# Patient Record
Sex: Male | Born: 1975 | Race: White | Hispanic: No | Smoking: Former smoker
Health system: Southern US, Community
[De-identification: ages and names within clinical notes are randomized; demographics above are authoritative.]

## PROBLEM LIST (undated history)

## (undated) DIAGNOSIS — F419 Anxiety disorder, unspecified: Secondary | ICD-10-CM

## (undated) DIAGNOSIS — N189 Chronic kidney disease, unspecified: Secondary | ICD-10-CM

## (undated) DIAGNOSIS — Z87442 Personal history of urinary calculi: Secondary | ICD-10-CM

## (undated) DIAGNOSIS — S81801S Unspecified open wound, right lower leg, sequela: Secondary | ICD-10-CM

## (undated) DIAGNOSIS — M25571 Pain in right ankle and joints of right foot: Secondary | ICD-10-CM

## (undated) HISTORY — DX: Anxiety disorder, unspecified: F41.9

## (undated) HISTORY — DX: Chronic kidney disease, unspecified: N18.9

## (undated) HISTORY — PX: KNEE ARTHROSCOPY: SUR90

---

## 2005-04-25 ENCOUNTER — Emergency Department: Payer: Self-pay | Admitting: Emergency Medicine

## 2011-12-17 ENCOUNTER — Ambulatory Visit: Payer: Self-pay | Admitting: Specialist

## 2012-01-01 ENCOUNTER — Ambulatory Visit: Payer: Self-pay | Admitting: Specialist

## 2013-01-05 ENCOUNTER — Ambulatory Visit: Payer: Self-pay | Admitting: General Practice

## 2013-01-18 ENCOUNTER — Encounter: Payer: Self-pay | Admitting: Physician Assistant

## 2013-01-19 ENCOUNTER — Encounter: Payer: Self-pay | Admitting: Physician Assistant

## 2013-01-22 ENCOUNTER — Emergency Department: Payer: Self-pay | Admitting: Emergency Medicine

## 2013-11-23 ENCOUNTER — Emergency Department: Payer: Self-pay | Admitting: Emergency Medicine

## 2013-11-23 LAB — CBC
HCT: 44.8 % (ref 40.0–52.0)
HGB: 14.8 g/dL (ref 13.0–18.0)
MCH: 30.5 pg (ref 26.0–34.0)
MCHC: 33 g/dL (ref 32.0–36.0)
MCV: 92 fL (ref 80–100)
Platelet: 341 10*3/uL (ref 150–440)
RBC: 4.85 10*6/uL (ref 4.40–5.90)
RDW: 13.5 % (ref 11.5–14.5)
WBC: 15.5 10*3/uL — AB (ref 3.8–10.6)

## 2013-11-23 LAB — BASIC METABOLIC PANEL
Anion Gap: 6 — ABNORMAL LOW (ref 7–16)
BUN: 10 mg/dL (ref 7–18)
CALCIUM: 9 mg/dL (ref 8.5–10.1)
CHLORIDE: 104 mmol/L (ref 98–107)
Co2: 26 mmol/L (ref 21–32)
Creatinine: 1.32 mg/dL — ABNORMAL HIGH (ref 0.60–1.30)
EGFR (African American): >60
EGFR (Non-African Amer.): >60
Glucose: 102 mg/dL — ABNORMAL HIGH (ref 65–99)
Osmolality: 271 (ref 275–301)
Potassium: 3.8 mmol/L (ref 3.5–5.1)
Sodium: 136 mmol/L (ref 136–145)

## 2013-11-23 LAB — TROPONIN I: Troponin-I: <0.02 ng/mL

## 2014-08-08 NOTE — Op Note (Signed)
PATIENT NAME:  Gary Savage, Gary Savage MR#:  [id number] DATE OF BIRTH:  July 10, 1975  DATE OF PROCEDURE:  01/01/2012  PREOPERATIVE DIAGNOSIS: Tear posterior horn lateral meniscus, left knee.  POSTOPERATIVE DIAGNOSIS: Tear posterior horn lateral meniscus, left knee.   PROCEDURE: Arthroscopic partial lateral meniscectomy, left knee.   SURGEON: Clare Gandyhristopher E. Rolen Conger, MD  ANESTHESIA: General.   COMPLICATIONS: None.   DESCRIPTION OF PROCEDURE: After adequate induction of general anesthesia, the left lower extremity is secured in the legholder in the usual manner for arthroscopy. The left knee is thoroughly prepped with alcohol and ChloraPrep and draped in standard sterile fashion. The joint is infiltrated with 10 mL of Marcaine with epinephrine. Diagnostic arthroscopy is performed. There is minimal increased synovium present. The patellofemoral joint is normal. The medial compartment is within normal limits with normal articular cartilage and a normal medial meniscus. In the intercondylar notch the anterior cruciate ligament is normal. In the lateral compartment, the articular surfaces are normal. There is seen to be a large horizontal cleavage-type tear of the posterior horn of the lateral meniscus. Using a combination of the basket forceps, the full radial resector and the ArthroWand, the torn portion of the meniscus is resected back to a stable rim. Careful search for any other pathology is negative. The joint is thoroughly irrigated multiple times. Portals are closed with 5-0 nylon. Soft bulky dressing is applied. Patient is returned to recovery room in satisfactory condition having tolerated the procedure quite well.    ____________________________ Clare Gandyhristopher E. Julieann Drummonds, MD ces:cms D: 01/01/2012 10:47:28 ET T: 01/01/2012 11:02:09 ET JOB#: [id number]  cc: Clare Gandyhristopher E. Crystelle Ferrufino, MD, <Dictator> Clare GandyHRISTOPHER E Ines Rebel MD ELECTRONICALLY SIGNED 01/02/2012 13:28

## 2015-01-26 ENCOUNTER — Encounter: Payer: Self-pay | Admitting: Physician Assistant

## 2015-01-26 ENCOUNTER — Ambulatory Visit: Payer: Self-pay | Admitting: Physician Assistant

## 2015-01-26 VITALS — BP 119/79 | HR 98 | Temp 98.0°F

## 2015-01-26 DIAGNOSIS — M5010 Cervical disc disorder with radiculopathy, unspecified cervical region: Secondary | ICD-10-CM

## 2015-01-26 NOTE — Progress Notes (Signed)
   Subjective:    Patient ID: Gary Savage, male    DOB: Mar 07, 1976, 39 y.o.   MRN: [id number]  HPI Patient c/o 2 day of neck pain upon awakening. 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  States radicular numbness to left upper extremities. Denies loss of function. Last incident greater than 3 years ago.    Review of Systems Only for above compliant     Objective:   Physical Exam Mild distress. Holding arm in adduction.  No cervical deformities. Decrease ROM with right lateral neck movement. Mild guarding at C-5 and C-6.  Vascular intact left upper extremity.        Assessment & Plan:  Cervical radicular pain to left upper extremity.  Start Prednisone, Tramadol, and Flexeril as directed.  Arm sling for comfort.  Follow up in 5 days.Marland Kitchen

## 2015-02-02 ENCOUNTER — Encounter: Payer: Self-pay | Admitting: Physician Assistant

## 2015-02-02 ENCOUNTER — Ambulatory Visit: Payer: Self-pay | Admitting: Physician Assistant

## 2015-02-02 VITALS — BP 108/75 | HR 104 | Temp 98.2°F

## 2015-02-02 DIAGNOSIS — R1110 Vomiting, unspecified: Secondary | ICD-10-CM

## 2015-02-02 MED ORDER — PROMETHAZINE HCL 25 MG PO TABS: 25.0000 mg | ORAL_TABLET | Freq: Three times a day (TID) | ORAL | Status: DC | PRN

## 2015-02-02 NOTE — Patient Instructions (Addendum)

## 2015-02-02 NOTE — Progress Notes (Signed)
[place]  [facility]   OCCUPATIONAL HEALTH PHYSICIAN ASSISTANT CLINIC HEALTH RECOMMENDATIONS  NAME: Dolores FrameKEVIN Rhyne DEPARTMENT: [place] [facility]  SCREEN REPORT: Based on this screening exam, there is a detected condition which would place the examinee, fellow employees, or patients at increased risk of physical impairment from his/her work duties  DIAGNOSIS: VOMITING   RECOMMENDATIONS/RESTSRICTIONS LISTED BELOW:  NO WORK TODAY 02/02/15, 10/15 IF NOT BETTER     SIGNATURE:___________________________________________ DATE:________

## 2015-02-02 NOTE — Progress Notes (Signed)
S:  Pt c/o vomiting, sx for 1 day, no fever/chills, no abd pain  denies cp/sob, denies camping, bad food, recent antibiotics, or exposure to bad water; vomited multiple times last night, last vomited at 3 am today, no diarrhea; also now has runny nose and congestion which started this morning, no cough, mucus was green this morning Remainder ros neg  O:  Vitals wnl except pulse increased at 104; nad, ENT wnl, neck supple no lymph, lungs c t a, cv rrr, abd soft nontender bs normalb/l, neuro intact  A:  Viral gastroenteritis, dehydration  P:  Reassurance, fluids, brat diet, immodium ad for diarrhea if needed, rx phenergan 25mg  tid prn vomiting, return if not better in 3 days, return earlier if worsening, work note given

## 2015-07-10 ENCOUNTER — Encounter: Payer: Self-pay | Admitting: Physician Assistant

## 2015-07-10 ENCOUNTER — Ambulatory Visit: Payer: Self-pay | Admitting: Physician Assistant

## 2015-07-10 VITALS — BP 110/75 | HR 103 | Temp 98.6°F

## 2015-07-10 DIAGNOSIS — J069 Acute upper respiratory infection, unspecified: Secondary | ICD-10-CM

## 2015-07-10 MED ORDER — CEFDINIR 300 MG PO CAPS: 300.0000 mg | ORAL_CAPSULE | Freq: Two times a day (BID) | ORAL | Status: AC

## 2015-07-10 MED ORDER — PREDNISONE 10 MG PO TABS: 30.0000 mg | ORAL_TABLET | Freq: Every day | ORAL | Status: DC

## 2015-07-10 NOTE — Progress Notes (Signed)
S: C/o runny nose and congestion for 3 days, cough and sore throat, ear pain, headache, had night sweats last night, voice is hoarse; no body aches,  no fever, chills, cp/sob, v/d; mucus was green this am but clear throughout the day, cough is sporadic,   Using otc meds: robitussin  O: PE: vitals wnl, nad, voice is hoarse, perrl eomi, normocephalic, tms dull, nasal mucosa red and swollen, throat red, neck supple no lymph, lungs c t a, cv rrr, neuro intact  A:  Acute uri   P: omnicef 300mg  bid x 10d, prednisone 30mg  qd x 3d, drink fluids, continue regular meds , use otc meds of choice, return if not improving in 5 days, return earlier if worsening

## 2015-08-24 ENCOUNTER — Ambulatory Visit: Payer: Self-pay | Admitting: Physician Assistant

## 2015-08-24 ENCOUNTER — Encounter: Payer: Self-pay | Admitting: Physician Assistant

## 2015-08-24 VITALS — BP 110/70 | HR 75 | Temp 97.9°F

## 2015-08-24 DIAGNOSIS — M62830 Muscle spasm of back: Secondary | ICD-10-CM

## 2015-08-24 MED ORDER — BACLOFEN 10 MG PO TABS: 10.0000 mg | ORAL_TABLET | Freq: Three times a day (TID) | ORAL | Status: DC

## 2015-08-24 MED ORDER — METHYLPREDNISOLONE 4 MG PO TBPK: ORAL_TABLET | ORAL | Status: DC

## 2015-08-24 NOTE — Progress Notes (Signed)
S:  C/o upper back/ left shoulder  pain for 3 days, no known injury, pain is worse with movement, increased with lifting, denies numbness, tingling, or changes in bowel/urinary habits, feels like a hot poker sticking in his back;  Using otc meds without relief Remainder ros neg  O:  Vitals wnl, nad, lungs c t a, cv rrr, spine nontender, muscles in left shoulder and upper back spasmed , decreased rom with,  n/v intact  A: acute back pain, muscle spasms  P: use wet heat followed by ice, stretches, return to clinic if not better in 3 t 5 days, return earlier if worsening, rx meds: medrol dose pack, baclofen

## 2015-12-03 ENCOUNTER — Ambulatory Visit: Payer: Self-pay | Admitting: Physician Assistant

## 2015-12-03 ENCOUNTER — Encounter: Payer: Self-pay | Admitting: Physician Assistant

## 2015-12-03 VITALS — BP 125/87 | HR 82 | Temp 98.1°F

## 2015-12-03 DIAGNOSIS — F4323 Adjustment disorder with mixed anxiety and depressed mood: Secondary | ICD-10-CM

## 2015-12-03 MED ORDER — ALPRAZOLAM 0.5 MG PO TABS: 0.5000 mg | ORAL_TABLET | Freq: Two times a day (BID) | ORAL | 0 refills | Status: DC | PRN

## 2015-12-03 MED ORDER — SERTRALINE HCL 50 MG PO TABS: 50.0000 mg | ORAL_TABLET | Freq: Every day | ORAL | 3 refills | Status: DC

## 2015-12-03 NOTE — Progress Notes (Signed)
S: c/o anxiety/depression, states he is so upset because his marriage is failing, has 2 small children and just can't imagine being away from them daily, hasn't been able to eat/drink for 2 days, every time he tries to eat he gets dry heaves, is able to keep a small amount of fluid down, concerned about being dehydrated, denies si/hi  O: vitals wnl, nad, skin with good turgor, mouth is a little dry, lungs c t a, cv rrr, depressed mood  A: anxiety/depression,   P: oral fluids, xanax 0.5mg  1 po bid #20 nr, zoloft 50mg  1 po qd, took pt to [facility], has appointment with Bart at 5pm on 12/04/15, pt to return if worsening

## 2016-06-26 ENCOUNTER — Other Ambulatory Visit: Payer: Self-pay | Admitting: Family Medicine

## 2016-06-26 DIAGNOSIS — R109 Unspecified abdominal pain: Secondary | ICD-10-CM

## 2016-06-26 DIAGNOSIS — N50812 Left testicular pain: Secondary | ICD-10-CM

## 2016-07-20 ENCOUNTER — Emergency Department: Payer: BLUE CROSS/BLUE SHIELD

## 2016-07-20 ENCOUNTER — Encounter: Payer: Self-pay | Admitting: Emergency Medicine

## 2016-07-20 ENCOUNTER — Emergency Department
Admission: EM | Admit: 2016-07-20 | Discharge: 2016-07-20 | Disposition: A | Discharge status: Home / Self Care | Payer: BLUE CROSS/BLUE SHIELD | Attending: Emergency Medicine | Admitting: Emergency Medicine

## 2016-07-20 DIAGNOSIS — Y999 Unspecified external cause status: Secondary | ICD-10-CM | POA: Diagnosis not present

## 2016-07-20 DIAGNOSIS — F17210 Nicotine dependence, cigarettes, uncomplicated: Secondary | ICD-10-CM | POA: Insufficient documentation

## 2016-07-20 DIAGNOSIS — S93402A Sprain of unspecified ligament of left ankle, initial encounter: Secondary | ICD-10-CM

## 2016-07-20 DIAGNOSIS — Y9389 Activity, other specified: Secondary | ICD-10-CM | POA: Insufficient documentation

## 2016-07-20 DIAGNOSIS — Y929 Unspecified place or not applicable: Secondary | ICD-10-CM | POA: Diagnosis not present

## 2016-07-20 DIAGNOSIS — X501XXA Overexertion from prolonged static or awkward postures, initial encounter: Secondary | ICD-10-CM | POA: Insufficient documentation

## 2016-07-20 DIAGNOSIS — S99912A Unspecified injury of left ankle, initial encounter: Secondary | ICD-10-CM | POA: Diagnosis present

## 2016-07-20 MED ORDER — OXYCODONE-ACETAMINOPHEN 5-325 MG PO TABS: 1.0000 | ORAL_TABLET | Freq: Four times a day (QID) | ORAL | 0 refills | Status: DC | PRN

## 2016-07-20 MED ORDER — IBUPROFEN 600 MG PO TABS: 600.0000 mg | ORAL_TABLET | Freq: Three times a day (TID) | ORAL | 0 refills | Status: DC | PRN

## 2016-07-20 MED ORDER — IBUPROFEN 600 MG PO TABS
600.0000 mg | ORAL_TABLET | Freq: Once | ORAL | Status: AC
Start: 2016-07-20 — End: 2016-07-20
  Administered 2016-07-20: 600 mg via ORAL
  Filled 2016-07-20: qty 1

## 2016-07-20 MED ORDER — OXYCODONE-ACETAMINOPHEN 5-325 MG PO TABS
1.0000 | ORAL_TABLET | Freq: Once | ORAL | Status: AC
  Administered 2016-07-20: 1 via ORAL
  Filled 2016-07-20: qty 1

## 2016-07-20 NOTE — ED Notes (Signed)

## 2016-07-20 NOTE — ED Provider Notes (Signed)
[facility] Emergency Department Provider Note   ____________________________________________   First MD Initiated Contact with Patient 07/20/16 1939     (approximate)  I have reviewed the triage vital signs and the nursing notes.   HISTORY  Chief Complaint Foot Pain    HPI MARTIN BELLING is a 41 y.o. male patient complain of left ankle and dorsal pain secondary to a twisting incident trying to catch his daughter. State his daughter was falling off a step. Instead occurred prior to arrival. Patient rates his pain as a 9/10.She describes the pain as "throbbing". No palliative measures prior to arrival.   History reviewed. No pertinent past medical history.  There are no active problems to display for this patient.   Past Surgical History:  Procedure Laterality Date  . KNEE ARTHROSCOPY Left     Prior to Admission medications   Medication Sig Start Date End Date Taking? Authorizing Provider  ibuprofen (ADVIL,MOTRIN) 600 MG tablet Take 1 tablet (600 mg total) by mouth every 8 (eight) hours as needed. 07/20/16   Joni Reining, PA-C  oxyCODONE-acetaminophen (ROXICET) 5-325 MG tablet Take 1 tablet by mouth every 6 (six) hours as needed for moderate pain. 07/20/16   Joni Reining, PA-C    Allergies Patient has no known allergies.  History reviewed. No pertinent family history.  Social History Social History  Substance Use Topics  . Smoking status: Current Every Day Smoker    Packs/day: 0.50    Types: Cigarettes  . Smokeless tobacco: Never Used  . Alcohol use 0.0 oz/week     Comment: occasional    Review of Systems Constitutional: No fever/chills Eyes: No visual changes. ENT: No sore throat. Cardiovascular: Denies chest pain. Respiratory: Denies shortness of breath. Gastrointestinal: No abdominal pain.  No nausea, no vomiting.  No diarrhea.  No constipation. Genitourinary: Negative for dysuria. Musculoskeletal: Left foot and ankle  pain Skin: Negative for rash. Neurological: Negative for headaches, focal weakness or numbness.    ____________________________________________   PHYSICAL EXAM:  VITAL SIGNS: ED Triage Vitals [07/20/16 1920]  Enc Vitals Group     BP 132/88     Pulse Rate 83     Resp 18     Temp 98.6 F (37 C)     Temp Source Oral     SpO2 99 %     Weight 160 lb (72.6 kg)     Height 6' (1.829 m)     Head Circumference      Peak Flow      Pain Score 9     Pain Loc      Pain Edu?      Excl. in GC?     Constitutional: Alert and oriented. Well appearing and in no acute distress. Eyes: Conjunctivae are normal. PERRL. EOMI. Head: Atraumatic. Nose: No congestion/rhinnorhea. Mouth/Throat: Mucous membranes are moist.  Oropharynx non-erythematous. Neck: No stridor.  No cervical spine tenderness to palpation. Hematological/Lymphatic/Immunilogical: No cervical lymphadenopathy. Cardiovascular: Normal rate, regular rhythm. Grossly normal heart sounds.  Good peripheral circulation. Respiratory: Normal respiratory effort.  No retractions. Lungs CTAB. Gastrointestinal: Soft and nontender. No distention. No abdominal bruits. No CVA tenderness. Musculoskeletal: No obvious deformity to the left ankle or foot. Patient decreased range of motion inversion and dose of flexion of the right foot. Neurologic:  Normal speech and language. No gross focal neurologic deficits are appreciated. No gait instability. Skin:  Skin is warm, dry and intact. No rash noted. No abrasion or ecchymosis. Psychiatric: Mood  and affect are normal. Speech and behavior are normal.  ____________________________________________   LABS (all labs ordered are listed, but only abnormal results are displayed)  Labs Reviewed - No data to display ____________________________________________  EKG   ____________________________________________  RADIOLOGY  Acute findings on x-rays of the left  ankle. ____________________________________________   PROCEDURES  Procedure(s) performed: None  Procedures  Critical Care performed: No  ____________________________________________   INITIAL IMPRESSION / ASSESSMENT AND PLAN / ED COURSE  Pertinent labs & imaging results that were available during my care of the patient were reviewed by me and considered in my medical decision making (see chart for details).  Pain left ankle. Discussed negative x-ray finding with patient. Patient placed in ankle stirrup splint and given crutches for ambulation. Patient given prescription for Percocet for 3 days and ibuprofen for 5 days. Patient advised to follow-up family doctor condition persists.      ____________________________________________   FINAL CLINICAL IMPRESSION(S) / ED DIAGNOSES  Final diagnoses:  Sprain of left ankle, unspecified ligament, initial encounter      NEW MEDICATIONS STARTED DURING THIS VISIT:  New Prescriptions   IBUPROFEN (ADVIL,MOTRIN) 600 MG TABLET    Take 1 tablet (600 mg total) by mouth every 8 (eight) hours as needed.   OXYCODONE-ACETAMINOPHEN (ROXICET) 5-325 MG TABLET    Take 1 tablet by mouth every 6 (six) hours as needed for moderate pain.     Note:  This document was prepared using Dragon voice recognition software and may include unintentional dictation errors.    Joni Reining, PA-C 07/20/16 2020    Jene Every, MD 07/20/16 2120

## 2016-07-20 NOTE — ED Notes (Signed)
Pt refused crutches. States he has some at home that he will use.

## 2016-07-20 NOTE — Discharge Instructions (Signed)
Were splinted and ambulate with crutches for 2-3 days as needed.

## 2016-07-20 NOTE — ED Triage Notes (Signed)
Pt says he was trying to catch his toddler who was falling off a step and twisted his left foot; pain and swelling around ankle and to top of foot;

## 2017-10-14 ENCOUNTER — Other Ambulatory Visit: Payer: Self-pay

## 2017-10-14 ENCOUNTER — Encounter: Payer: Self-pay | Admitting: Emergency Medicine

## 2017-10-14 ENCOUNTER — Emergency Department: Payer: BLUE CROSS/BLUE SHIELD

## 2017-10-14 ENCOUNTER — Emergency Department
Admission: EM | Admit: 2017-10-14 | Discharge: 2017-10-14 | Disposition: A | Discharge status: Home / Self Care | Payer: BLUE CROSS/BLUE SHIELD | Attending: Emergency Medicine | Admitting: Emergency Medicine

## 2017-10-14 DIAGNOSIS — F17210 Nicotine dependence, cigarettes, uncomplicated: Secondary | ICD-10-CM | POA: Diagnosis not present

## 2017-10-14 DIAGNOSIS — J029 Acute pharyngitis, unspecified: Secondary | ICD-10-CM | POA: Diagnosis present

## 2017-10-14 DIAGNOSIS — J020 Streptococcal pharyngitis: Secondary | ICD-10-CM | POA: Insufficient documentation

## 2017-10-14 LAB — CBC WITH DIFFERENTIAL/PLATELET
BASOS ABS: 0.1 10*3/uL (ref 0–0.1)
Basophils Relative: 0 %
EOS PCT: 0 %
Eosinophils Absolute: 0 10*3/uL (ref 0–0.7)
HEMATOCRIT: 40.1 % (ref 40.0–52.0)
Hemoglobin: 13.6 g/dL (ref 13.0–18.0)
Lymphocytes Relative: 7 %
Lymphs Abs: 1.1 10*3/uL (ref 1.0–3.6)
MCH: 31 pg (ref 26.0–34.0)
MCHC: 33.8 g/dL (ref 32.0–36.0)
MCV: 91.5 fL (ref 80.0–100.0)
MONO ABS: 1.2 10*3/uL — AB (ref 0.2–1.0)
Monocytes Relative: 7 %
Neutro Abs: 14.2 10*3/uL — ABNORMAL HIGH (ref 1.4–6.5)
Neutrophils Relative %: 86 %
Platelets: 304 10*3/uL (ref 150–440)
RBC: 4.38 MIL/uL — AB (ref 4.40–5.90)
RDW: 12.9 % (ref 11.5–14.5)
WBC: 16.6 10*3/uL — ABNORMAL HIGH (ref 3.8–10.6)

## 2017-10-14 LAB — BASIC METABOLIC PANEL
Anion gap: 9 (ref 5–15)
BUN: 8 mg/dL (ref 6–20)
CALCIUM: 8.7 mg/dL — AB (ref 8.9–10.3)
CO2: 23 mmol/L (ref 22–32)
Chloride: 104 mmol/L (ref 98–111)
Creatinine, Ser: 1.24 mg/dL (ref 0.61–1.24)
GFR calc Af Amer: >60 mL/min (ref >60)
GFR calc non Af Amer: >60 mL/min (ref >60)
GLUCOSE: 126 mg/dL — AB (ref 70–99)
Potassium: 3.9 mmol/L (ref 3.5–5.1)
Sodium: 136 mmol/L (ref 135–145)

## 2017-10-14 LAB — GROUP A STREP BY PCR: GROUP A STREP BY PCR: DETECTED — AB

## 2017-10-14 MED ORDER — AMOXICILLIN 875 MG PO TABS: 875.0000 mg | ORAL_TABLET | Freq: Two times a day (BID) | ORAL | 0 refills | Status: DC

## 2017-10-14 MED ORDER — KETOROLAC TROMETHAMINE 30 MG/ML IJ SOLN
30.0000 mg | Freq: Once | INTRAMUSCULAR | Status: AC
  Administered 2017-10-14: 30 mg via INTRAVENOUS
  Filled 2017-10-14: qty 1

## 2017-10-14 MED ORDER — SODIUM CHLORIDE 0.9 % IV BOLUS
1000.0000 mL | Freq: Once | INTRAVENOUS | Status: AC
  Administered 2017-10-14: 1000 mL via INTRAVENOUS

## 2017-10-14 MED ORDER — MAGIC MOUTHWASH W/LIDOCAINE: 5.0000 mL | Freq: Four times a day (QID) | ORAL | 0 refills | Status: DC

## 2017-10-14 NOTE — ED Triage Notes (Signed)
Pt states sore throat and knot on right side of neck that began last pm, pt also c/o lower back pain. Appears in NAD.

## 2017-10-14 NOTE — ED Provider Notes (Signed)
[facility] Emergency Department Provider Note   ____________________________________________   First MD Initiated Contact with Patient 10/14/17 [phone]     (approximate)  I have reviewed the triage vital signs and the nursing notes.   HISTORY  Chief Complaint Sore Throat    HPI Gary Savage is a 42 y.o. male patient complain acute onset of sore throat and edema to the right side of neck.  Patient increased difficulty with swallowing but can tolerate fluids.  Patient state fever and chills associated with complaint.  Patient also complained of low back pain but has a history of chronic back pain.  Patient states feels lightheaded when changing from laying to sitting or standing.  Patient rates pain as 8/10.  History reviewed. No pertinent past medical history.  There are no active problems to display for this patient.   Past Surgical History:  Procedure Laterality Date  . KNEE ARTHROSCOPY Left     Prior to Admission medications   Medication Sig Start Date End Date Taking? Authorizing Provider  amoxicillin (AMOXIL) 875 MG tablet Take 1 tablet (875 mg total) by mouth 2 (two) times daily. 10/14/17   Joni Reining, PA-C  ibuprofen (ADVIL,MOTRIN) 600 MG tablet Take 1 tablet (600 mg total) by mouth every 8 (eight) hours as needed. 07/20/16   Joni Reining, PA-C  magic mouthwash w/lidocaine SOLN Take 5 mLs by mouth 4 (four) times daily. 10/14/17   Joni Reining, PA-C  oxyCODONE-acetaminophen (ROXICET) 5-325 MG tablet Take 1 tablet by mouth every 6 (six) hours as needed for moderate pain. 07/20/16   Joni Reining, PA-C    Allergies Patient has no known allergies.  No family history on file.  Social History Social History   Tobacco Use  . Smoking status: Current Every Day Smoker    Packs/day: 0.50    Types: Cigarettes  . Smokeless tobacco: Never Used  Substance Use Topics  . Alcohol use: Yes    Alcohol/week: 0.0 oz    Comment: occasional  .  Drug use: No    Review of Systems Constitutional: No fever/chills Eyes: No visual changes. ENT: Sore throat.   Cardiovascular: Denies chest pain. Respiratory: Denies shortness of breath. Gastrointestinal: No abdominal pain.  No nausea, no vomiting.  No diarrhea.  No constipation. Genitourinary: Negative for dysuria. Musculoskeletal: Positive for back pain. Skin: Negative for rash. Neurological: Negative for headaches, focal weakness or numbness.   ____________________________________________   PHYSICAL EXAM:  VITAL SIGNS: ED Triage Vitals  Enc Vitals Group     BP 10/14/17 0721 (!) 141/94     Pulse Rate 10/14/17 0720 (!) 113     Resp 10/14/17 0720 18     Temp 10/14/17 0720 99.4 F (37.4 C)     Temp Source 10/14/17 0720 Oral     SpO2 10/14/17 0720 99 %     Weight 10/14/17 0720 170 lb (77.1 kg)     Height 10/14/17 0720 6' (1.829 m)     Head Circumference --      Peak Flow --      Pain Score 10/14/17 0720 8     Pain Loc --      Pain Edu? --      Excl. in GC? --    Constitutional: Alert and oriented. Well appearing and in no acute distress. Nose: No congestion/rhinnorhea. Mouth/Throat: Mucous membranes are moist.  Oropharynx erythematous. Neck: No stridor. Hematological/Lymphatic/Immunilogical: right cervical lymphadenopathy. Cardiovascular: Tachycardic, regular rhythm. Grossly normal heart sounds.  Good peripheral circulation. Respiratory: Normal respiratory effort.  No retractions. Lungs CTAB. Gastrointestinal: Soft and nontender. No distention. No abdominal bruits. No CVA tenderness. Neurologic:  Normal speech and language. No gross focal neurologic deficits are appreciated. No gait instability. Skin:  Skin is warm, dry and intact. No rash noted. Psychiatric: Mood and affect are normal. Speech and behavior are normal.  ____________________________________________   LABS (all labs ordered are listed, but only abnormal results are displayed)  Labs Reviewed  GROUP  A STREP BY PCR - Abnormal; Notable for the following components:      Result Value   Group A Strep by PCR DETECTED (*)    All other components within normal limits  BASIC METABOLIC PANEL - Abnormal; Notable for the following components:   Glucose, Bld 126 (*)    Calcium 8.7 (*)    All other components within normal limits  CBC WITH DIFFERENTIAL/PLATELET - Abnormal; Notable for the following components:   WBC 16.6 (*)    RBC 4.38 (*)    Neutro Abs 14.2 (*)    Monocytes Absolute 1.2 (*)    All other components within normal limits   ____________________________________________  EKG   ____________________________________________  RADIOLOGY  ED MD interpretation:    Official radiology report(s): Dg Neck Soft Tissue  Result Date: 10/14/2017 CLINICAL DATA:  Left lateral neck edema with dysphagia and fever. EXAM: NECK SOFT TISSUES - 1+ VIEW COMPARISON:  None. FINDINGS: Prevertebral soft tissues are normal. Pharynx is within normal. Epiglottis and subglottic airway are normal. There is mild spondylosis of the cervical spinal mild disc space narrowing at the C5-6 level. IMPRESSION: No acute findings. Electronically Signed   By: Elberta Fortisaniel  Boyle M.D.   On: 10/14/2017 09:02    ____________________________________________   PROCEDURES  Procedure(s) performed: None  Procedures  Critical Care performed: No  ____________________________________________   INITIAL IMPRESSION / ASSESSMENT AND PLAN / ED COURSE  As part of my medical decision making, I reviewed the following data within the electronic MEDICAL RECORD NUMBER    Patient presents with sore throat and right cervical adenopathy.  Patient was positive strep pharyngitis.  Patient given discharge care instruction advised take medication as directed.  Patient advised follow-up PCP for continued care.      ____________________________________________   FINAL CLINICAL IMPRESSION(S) / ED DIAGNOSES  Final diagnoses:  Strep  pharyngitis     ED Discharge Orders        Ordered    amoxicillin (AMOXIL) 875 MG tablet  2 times daily     10/14/17 0914    magic mouthwash w/lidocaine SOLN  4 times daily     10/14/17 0914       Note:  This document was prepared using Dragon voice recognition software and may include unintentional dictation errors.    Joni ReiningSmith, Ronald K, PA-C 10/14/17 [id number]    Emily FilbertWilliams, Jonathan E, MD 10/14/17 [phone]

## 2017-10-14 NOTE — ED Notes (Signed)
See triage note  States he developed body aches and subjective fever last pm pain started in lower back which moved into right leg  About 12mn he developed sore throat and increased pain with swallowing

## 2017-12-31 ENCOUNTER — Other Ambulatory Visit: Payer: Self-pay | Admitting: Family Medicine

## 2017-12-31 DIAGNOSIS — M5412 Radiculopathy, cervical region: Secondary | ICD-10-CM

## 2018-01-14 ENCOUNTER — Ambulatory Visit: Payer: BLUE CROSS/BLUE SHIELD

## 2019-07-17 ENCOUNTER — Ambulatory Visit: Payer: BC Managed Care – PPO | Attending: Internal Medicine

## 2019-07-17 DIAGNOSIS — Z23 Encounter for immunization: Secondary | ICD-10-CM

## 2019-07-17 NOTE — Progress Notes (Signed)
   Covid-19 Vaccination Clinic  Name:  Gary Savage    MRN: [id number] DOB: 08/04/1975  07/17/2019  Gary Savage was observed post Covid-19 immunization for 15 minutes without incident. He was provided with Vaccine Information Sheet and instruction to access the V-Safe system.   Gary Savage was instructed to call 911 with any severe reactions post vaccine: Marland Kitchen Difficulty breathing  . Swelling of face and throat  . A fast heartbeat  . A bad rash all over body  . Dizziness and weakness   Immunizations Administered    Name Date Dose VIS Date Route   Pfizer COVID-19 Vaccine 07/17/2019  8:51 AM 0.3 mL 04/01/2019 Intramuscular   Manufacturer: [employer], [employer]   Lot: [id number]   NDC: [address]-9

## 2019-08-09 ENCOUNTER — Ambulatory Visit: Payer: BC Managed Care – PPO | Attending: Internal Medicine

## 2019-08-09 DIAGNOSIS — Z23 Encounter for immunization: Secondary | ICD-10-CM

## 2019-08-09 NOTE — Progress Notes (Signed)
   Covid-19 Vaccination Clinic  Name:  KONSTANTINOS CORDOBA    MRN: [id number] DOB: 05-Jun-1975  08/09/2019  Mr. Teuscher was observed post Covid-19 immunization for 15 minutes without incident. He was provided with Vaccine Information Sheet and instruction to access the V-Safe system.   Mr. Carstens was instructed to call 911 with any severe reactions post vaccine: Marland Kitchen Difficulty breathing  . Swelling of face and throat  . A fast heartbeat  . A bad rash all over body  . Dizziness and weakness   Immunizations Administered    Name Date Dose VIS Date Route   Pfizer COVID-19 Vaccine 08/09/2019 11:18 AM 0.3 mL 06/15/2018 Intramuscular   Manufacturer: [employer], [employer]   Lot: [id number]   NDC: [address]-6

## 2019-11-25 ENCOUNTER — Ambulatory Visit
Admission: RE | Admit: 2019-11-25 | Discharge: 2019-11-25 | Disposition: A | Discharge status: Home / Self Care | Payer: BLUE CROSS/BLUE SHIELD | Source: Ambulatory Visit | Attending: Emergency Medicine | Admitting: Emergency Medicine

## 2019-11-25 ENCOUNTER — Other Ambulatory Visit: Payer: Self-pay

## 2019-11-25 VITALS — BP 128/91 | HR 92 | Temp 98.1°F | Resp 16 | Wt 175.0 lb

## 2019-11-25 DIAGNOSIS — M5442 Lumbago with sciatica, left side: Secondary | ICD-10-CM

## 2019-11-25 MED ORDER — KETOROLAC TROMETHAMINE 30 MG/ML IJ SOLN
30.0000 mg | Freq: Once | INTRAMUSCULAR | Status: AC
  Administered 2019-11-25: 30 mg via INTRAMUSCULAR

## 2019-11-25 NOTE — Discharge Instructions (Signed)
You were given an injection of Toradol.    Take your already prescribed muscle relaxer.    Follow up with your primary care provider if your symptoms are not improving.

## 2019-11-25 NOTE — ED Triage Notes (Signed)
Patient in office [facility] c/o Nagging,stabbing lower back pain x today  OTC: Tylenol

## 2019-11-25 NOTE — ED Provider Notes (Signed)
Gary Savage    CSN: [id number] Arrival date & time: 11/25/19  1441      History   Chief Complaint Chief Complaint  Patient presents with   Back Pain    HPI Gary Savage is a 44 y.o. male.   Patient presents with left lower back pain since this morning.  The pain started when he was moving a pool table; radiates to his left hip.  He rates it 8 out of 10.  Worse with sitting; improves with standing and lying flat.  He describes the pain as nagging.  He denies numbness or weakness in his lower extremities.  He denies bowel or bladder incontinence.  He denies abdominal pain, dysuria, penile discharge, testicular pain, or other symptoms.  Patient has a history of DDD and cervical pain.    The history is provided by the patient.    History reviewed. No pertinent past medical history.  There are no problems to display for this patient.   Past Surgical History:  Procedure Laterality Date   KNEE ARTHROSCOPY Left        Home Medications    Prior to Admission medications   Medication Sig Start Date End Date Taking? Authorizing Provider  amoxicillin (AMOXIL) 875 MG tablet Take 1 tablet (875 mg total) by mouth 2 (two) times daily. 10/14/17   Joni Reining, PA-C  ibuprofen (ADVIL,MOTRIN) 600 MG tablet Take 1 tablet (600 mg total) by mouth every 8 (eight) hours as needed. 07/20/16   Joni Reining, PA-C  magic mouthwash w/lidocaine SOLN Take 5 mLs by mouth 4 (four) times daily. 10/14/17   Joni Reining, PA-C  oxyCODONE-acetaminophen (ROXICET) 5-325 MG tablet Take 1 tablet by mouth every 6 (six) hours as needed for moderate pain. 07/20/16   Joni Reining, PA-C    Family History Family History  Problem Relation Age of Onset   Healthy Mother     Social History Social History   Tobacco Use   Smoking status: Current Every Day Smoker    Packs/day: 0.50    Types: Cigarettes   Smokeless tobacco: Never Used  Substance Use Topics   Alcohol use: Yes     Alcohol/week: 0.0 standard drinks    Comment: occasional   Drug use: No     Allergies   Patient has no known allergies.   Review of Systems Review of Systems  Constitutional: Negative for chills and fever.  HENT: Negative for ear pain and sore throat.   Eyes: Negative for pain and visual disturbance.  Respiratory: Negative for cough and shortness of breath.   Cardiovascular: Negative for chest pain and palpitations.  Gastrointestinal: Negative for abdominal pain and vomiting.  Genitourinary: Negative for dysuria and hematuria.  Musculoskeletal: Positive for back pain. Negative for gait problem.  Skin: Negative for color change and rash.  Neurological: Negative for seizures and syncope.  All other systems reviewed and are negative.    Physical Exam Triage Vital Signs ED Triage Vitals  Enc Vitals Group     BP      Pulse      Resp      Temp      Temp src      SpO2      Weight      Height      Head Circumference      Peak Flow      Pain Score      Pain Loc      Pain Edu?  Excl. in GC?    No data found.  Updated Vital Signs BP (!) 128/91 (BP Location: Left Arm)    Pulse 92    Temp 98.1 F (36.7 C) (Oral)    Resp 16    Wt 175 lb (79.4 kg)    SpO2 99%    BMI 23.73 kg/m   Visual Acuity Right Eye Distance:   Left Eye Distance:   Bilateral Distance:    Right Eye Near:   Left Eye Near:    Bilateral Near:     Physical Exam Vitals and nursing note reviewed.  Constitutional:      General: He is not in acute distress.    Appearance: He is well-developed. He is not ill-appearing.  HENT:     Head: Normocephalic and atraumatic.     Mouth/Throat:     Mouth: Mucous membranes are moist.  Eyes:     Conjunctiva/sclera: Conjunctivae normal.  Cardiovascular:     Rate and Rhythm: Normal rate and regular rhythm.     Heart sounds: No murmur heard.   Pulmonary:     Effort: Pulmonary effort is normal. No respiratory distress.     Breath sounds: Normal breath  sounds.  Abdominal:     Palpations: Abdomen is soft.     Tenderness: There is no abdominal tenderness. There is no right CVA tenderness, left CVA tenderness, guarding or rebound.  Musculoskeletal:        General: Tenderness present. No swelling or deformity. Normal range of motion.     Cervical back: Neck supple.       Back:  Skin:    General: Skin is warm and dry.     Capillary Refill: Capillary refill takes less than 2 seconds.     Findings: No bruising, erythema, lesion or rash.  Neurological:     General: No focal deficit present.     Mental Status: He is alert and oriented to person, place, and time.     Sensory: No sensory deficit.     Motor: No weakness.     Gait: Gait normal.  Psychiatric:        Mood and Affect: Mood normal.        Behavior: Behavior normal.      UC Treatments / Results  Labs (all labs ordered are listed, but only abnormal results are displayed) Labs Reviewed - No data to display  EKG   Radiology No results found.  Procedures Procedures (including critical care time)  Medications Ordered in UC Medications  ketorolac (TORADOL) 30 MG/ML injection 30 mg (30 mg Intramuscular Given 11/25/19 1511)    Initial Impression / Assessment and Plan / UC Course  I have reviewed the triage vital signs and the nursing notes.  Pertinent labs & imaging results that were available during my care of the patient were reviewed by me and considered in my medical decision making (see chart for details).   Left lower back pain with left-sided sciatica.  Patient declined muscle relaxer; he states he already has this at home.  He declines prescription for ibuprofen.  Injection of Toradol given here.  Instructed him to use the muscle relaxer he has already prescribed.  Instructed him to follow-up with his PCP if his symptoms or not improving.  Patient agrees to plan of care.   Final Clinical Impressions(s) / UC Diagnoses   Final diagnoses:  Acute left-sided low back  pain with left-sided sciatica     Discharge Instructions     You  were given an injection of Toradol.    Take your already prescribed muscle relaxer.    Follow up with your primary care provider if your symptoms are not improving.       ED Prescriptions    None     I have reviewed the PDMP during this encounter.   Mickie Bail, NP 11/25/19 1513

## 2020-03-05 ENCOUNTER — Ambulatory Visit: Payer: Self-pay

## 2020-03-08 ENCOUNTER — Emergency Department: Payer: BLUE CROSS/BLUE SHIELD

## 2020-03-08 ENCOUNTER — Other Ambulatory Visit: Payer: Self-pay

## 2020-03-08 ENCOUNTER — Encounter: Payer: Self-pay | Admitting: Emergency Medicine

## 2020-03-08 ENCOUNTER — Emergency Department
Admission: EM | Admit: 2020-03-08 | Discharge: 2020-03-08 | Disposition: A | Discharge status: Home / Self Care | Payer: BLUE CROSS/BLUE SHIELD | Attending: Emergency Medicine | Admitting: Emergency Medicine

## 2020-03-08 DIAGNOSIS — F17210 Nicotine dependence, cigarettes, uncomplicated: Secondary | ICD-10-CM | POA: Insufficient documentation

## 2020-03-08 DIAGNOSIS — N50811 Right testicular pain: Secondary | ICD-10-CM | POA: Diagnosis not present

## 2020-03-08 DIAGNOSIS — N50812 Left testicular pain: Secondary | ICD-10-CM | POA: Insufficient documentation

## 2020-03-08 DIAGNOSIS — N50819 Testicular pain, unspecified: Secondary | ICD-10-CM

## 2020-03-08 LAB — CHLAMYDIA/NGC RT PCR (ARMC ONLY)
Chlamydia Tr: NOT DETECTED
N gonorrhoeae: NOT DETECTED

## 2020-03-08 LAB — URINALYSIS, COMPLETE (UACMP) WITH MICROSCOPIC
Bilirubin Urine: NEGATIVE
Glucose, UA: NEGATIVE mg/dL
Hgb urine dipstick: NEGATIVE
Ketones, ur: NEGATIVE mg/dL
Nitrite: NEGATIVE
Protein, ur: 100 mg/dL — AB
Specific Gravity, Urine: 1.029 (ref 1.005–1.030)
Squamous Epithelial / LPF: NONE SEEN (ref 0–5)
WBC, UA: >50 WBC/hpf — ABNORMAL HIGH (ref 0–5)
pH: 6 (ref 5.0–8.0)

## 2020-03-08 MED ORDER — ONDANSETRON 4 MG PO TBDP
4.0000 mg | ORAL_TABLET | Freq: Once | ORAL | Status: AC
  Administered 2020-03-08: 4 mg via ORAL
  Filled 2020-03-08: qty 1

## 2020-03-08 MED ORDER — FENTANYL CITRATE (PF) 100 MCG/2ML IJ SOLN
50.0000 ug | Freq: Once | INTRAMUSCULAR | Status: AC
  Administered 2020-03-08: 50 ug via INTRAMUSCULAR
  Filled 2020-03-08: qty 2

## 2020-03-08 MED ORDER — DOXYCYCLINE HYCLATE 100 MG PO TABS: 100.0000 mg | ORAL_TABLET | Freq: Two times a day (BID) | ORAL | 0 refills | Status: DC

## 2020-03-08 MED ORDER — AZITHROMYCIN 500 MG PO TABS
1000.0000 mg | ORAL_TABLET | Freq: Once | ORAL | Status: AC
  Administered 2020-03-08: 1000 mg via ORAL
  Filled 2020-03-08: qty 2

## 2020-03-08 MED ORDER — OXYCODONE-ACETAMINOPHEN 5-325 MG PO TABS: 1.0000 | ORAL_TABLET | ORAL | 0 refills | Status: DC | PRN

## 2020-03-08 MED ORDER — CEFTRIAXONE SODIUM 250 MG IJ SOLR
250.0000 mg | Freq: Once | INTRAMUSCULAR | Status: AC
  Administered 2020-03-08: 250 mg via INTRAMUSCULAR
  Filled 2020-03-08: qty 250

## 2020-03-08 NOTE — ED Notes (Signed)
Pt presents to ED with cc/o of having testicle pain that has been ongoing for a week or so, pt states he has been seen for this and states he has been taking cipro as RX'ed. Pt states he has not missed any doses. Pt states L testicle hurts more than R but endorses R hurts as well. Pt denies any burning when urinating or urinary symptoms at this time. Pt states since taking ABX swelling has gone down but pain remains the same.

## 2020-03-08 NOTE — Discharge Instructions (Addendum)
Please take antibiotics as prescribed for their entire course.  Please take your pain medication as needed but only as written.  Please return to the emergency department for any worsening pain, fever, or any other symptom personally concerning to yourself.  Otherwise please follow-up with your doctor for recheck/reevaluation.

## 2020-03-08 NOTE — ED Notes (Signed)
First RN note:  Pt recommended to come here from [facility] clinic.  Pt has had testicular swelling and they have tested for STD's and treated him with antibiotics with no relief.  No [place] has been performed for the patient at this time.

## 2020-03-08 NOTE — ED Triage Notes (Signed)
Pt c/o testicular pain and swelling x 1 week, pt states mostly on L but c/o pain to both. Pt states was put on Cipro by PCP, pt states some relief of swelling but not the pain.

## 2020-03-08 NOTE — ED Provider Notes (Signed)
[facility] Emergency Department Provider Note  Time seen: 10:06 AM  I have reviewed the triage vital signs and the nursing notes.   HISTORY  Chief Complaint Testicle Pain   HPI Gary Savage is a 44 y.o. male with no significant past medical history presents to the emergency department for testicular pain.  According to the patient over the past week or so he has been experiencing testicular pain and swelling especially in the left testicle but somewhat bilaterally.  Patient states he saw his doctor who put him on ciprofloxacin, states the discomfort has not changed but the redness and swelling has gone down since starting the antibiotics.  Patient denies any penile discharge.  Denies any hematuria or dysuria.  Patient states he is sexually active and has had new partners in the past 6 months but states always wears condoms.  No anal intercourse.   History reviewed. No pertinent past medical history.  There are no problems to display for this patient.   Past Surgical History:  Procedure Laterality Date  . KNEE ARTHROSCOPY Left     Prior to Admission medications   Medication Sig Start Date End Date Taking? Authorizing Provider  amoxicillin (AMOXIL) 875 MG tablet Take 1 tablet (875 mg total) by mouth 2 (two) times daily. 10/14/17   Joni Reining, PA-C  ibuprofen (ADVIL,MOTRIN) 600 MG tablet Take 1 tablet (600 mg total) by mouth every 8 (eight) hours as needed. 07/20/16   Joni Reining, PA-C  magic mouthwash w/lidocaine SOLN Take 5 mLs by mouth 4 (four) times daily. 10/14/17   Joni Reining, PA-C  oxyCODONE-acetaminophen (ROXICET) 5-325 MG tablet Take 1 tablet by mouth every 6 (six) hours as needed for moderate pain. 07/20/16   Joni Reining, PA-C    Allergies  Allergen Reactions  . Hydrocodone Itching    Family History  Problem Relation Age of Onset  . Healthy Mother     Social History Social History   Tobacco Use  . Smoking status: Current  Every Day Smoker    Packs/day: 0.50    Types: Cigarettes  . Smokeless tobacco: Never Used  Substance Use Topics  . Alcohol use: Yes    Alcohol/week: 0.0 standard drinks    Comment: occasional  . Drug use: No    Review of Systems Constitutional: Negative for fever. Cardiovascular: Negative for chest pain. Respiratory: Negative for shortness of breath. Gastrointestinal: Negative for abdominal pain Genitourinary: Negative for urinary compaints.  Positive for testicular pain and swelling left greater than right. Musculoskeletal: Negative for musculoskeletal complaints Skin: Mild redness of the scrotum. Neurological: Negative for headache All other ROS negative  ____________________________________________   PHYSICAL EXAM:  VITAL SIGNS: ED Triage Vitals  Enc Vitals Group     BP 03/08/20 0938 (!) 141/94     Pulse Rate 03/08/20 0938 (!) 103     Resp 03/08/20 0938 18     Temp 03/08/20 0938 98.2 F (36.8 C)     Temp Source 03/08/20 0938 Oral     SpO2 03/08/20 0938 99 %     Weight 03/08/20 0939 180 lb (81.6 kg)     Height 03/08/20 0939 6' (1.829 m)     Head Circumference --      Peak Flow --      Pain Score 03/08/20 0954 7     Pain Loc --      Pain Edu? --      Excl. in GC? --  Constitutional: Alert and oriented. Well appearing and in no distress. Eyes: Normal exam ENT      Head: Normocephalic and atraumatic.      Mouth/Throat: Mucous membranes are moist. Cardiovascular: Normal rate, regular rhythm.  Respiratory: Normal respiratory effort without tachypnea nor retractions. Breath sounds are clear Gastrointestinal: Soft and nontender. No distention.  GI: Normal penile exam, circumcised.  Mild tenderness of the right testicle, moderate to significant tenderness of the left testicle especially over the epididymis.  No obvious hernia is noted.  Mild erythema over the of the scrotum. Musculoskeletal: Nontender with normal range of motion in all extremities.  Neurologic:   Normal speech and language. No gross focal neurologic deficits Skin:  Skin is warm, dry and intact.  Psychiatric: Mood and affect are normal.   ____________________________________________     RADIOLOGY  Ultrasound negative  ____________________________________________   INITIAL IMPRESSION / ASSESSMENT AND PLAN / ED COURSE  Pertinent labs & imaging results that were available during my care of the patient were reviewed by me and considered in my medical decision making (see chart for details).   Patient presents to the emergency department for 1+ weeks of scrotal pain and swelling especially of the left testicle.  Patient has seen his doctor is currently on ciprofloxacin.  Patient states the swelling and redness has decreased but the pain remains.  Patient is sexually active but denies any penile discharge, states he wears condoms.  We will check a urinalysis, urine culture and a gonorrhea/chlamydia.  We will obtain a scrotal ultrasound and dose pain medication.  Patient agreeable to plan of care.  Ultrasound is reassuring.  However patient's urinalysis does show greater than 50 white cells.-Suspect STD related.  We will dose IM Rocephin and place the patient on doxycycline.  Urine culture has been sent.  Chlamydia/gonorrhea test pending.  RICHARDS PHERIGO was evaluated in Emergency Department on 03/08/2020 for the symptoms described in the history of present illness. He was evaluated in the context of the global COVID-19 pandemic, which necessitated consideration that the patient might be at risk for infection with the SARS-CoV-2 virus that causes COVID-19. Institutional protocols and algorithms that pertain to the evaluation of patients at risk for COVID-19 are in a state of rapid change based on information released by regulatory bodies including the CDC and federal and state organizations. These policies and algorithms were followed during the patient's care in the  ED.  ____________________________________________   FINAL CLINICAL IMPRESSION(S) / ED DIAGNOSES  Testicular pain   Minna Antis, MD 03/08/20 1242

## 2020-03-09 LAB — URINE CULTURE: Culture: NO GROWTH

## 2020-03-14 ENCOUNTER — Other Ambulatory Visit: Payer: Self-pay | Admitting: Urology

## 2020-03-14 DIAGNOSIS — R102 Pelvic and perineal pain: Secondary | ICD-10-CM

## 2020-03-26 ENCOUNTER — Other Ambulatory Visit: Payer: Self-pay

## 2020-03-26 ENCOUNTER — Ambulatory Visit
Admission: RE | Admit: 2020-03-26 | Discharge: 2020-03-26 | Disposition: A | Discharge status: Home / Self Care | Payer: BLUE CROSS/BLUE SHIELD | Source: Ambulatory Visit | Attending: Urology | Admitting: Urology

## 2020-03-26 DIAGNOSIS — R102 Pelvic and perineal pain: Secondary | ICD-10-CM | POA: Insufficient documentation

## 2020-03-26 MED ORDER — IOHEXOL 300 MG/ML  SOLN
100.0000 mL | Freq: Once | INTRAMUSCULAR | Status: AC | PRN
  Administered 2020-03-26: 100 mL via INTRAVENOUS

## 2020-06-20 ENCOUNTER — Other Ambulatory Visit: Payer: Self-pay | Admitting: Orthopedic Surgery

## 2020-06-20 DIAGNOSIS — M87052 Idiopathic aseptic necrosis of left femur: Secondary | ICD-10-CM

## 2020-10-19 DIAGNOSIS — S92101A Unspecified fracture of right talus, initial encounter for closed fracture: Secondary | ICD-10-CM

## 2020-10-19 HISTORY — DX: Unspecified fracture of right talus, initial encounter for closed fracture: S92.101A

## 2020-11-12 ENCOUNTER — Emergency Department: Payer: PRIVATE HEALTH INSURANCE

## 2020-11-12 ENCOUNTER — Other Ambulatory Visit: Payer: Self-pay

## 2020-11-12 ENCOUNTER — Emergency Department
Admission: EM | Admit: 2020-11-12 | Discharge: 2020-11-12 | Disposition: A | Discharge status: Home / Self Care | Payer: PRIVATE HEALTH INSURANCE | Attending: Emergency Medicine | Admitting: Emergency Medicine

## 2020-11-12 ENCOUNTER — Encounter: Payer: Self-pay | Admitting: Emergency Medicine

## 2020-11-12 DIAGNOSIS — F17210 Nicotine dependence, cigarettes, uncomplicated: Secondary | ICD-10-CM | POA: Insufficient documentation

## 2020-11-12 DIAGNOSIS — M79671 Pain in right foot: Secondary | ICD-10-CM | POA: Diagnosis not present

## 2020-11-12 DIAGNOSIS — Y990 Civilian activity done for income or pay: Secondary | ICD-10-CM | POA: Insufficient documentation

## 2020-11-12 DIAGNOSIS — W228XXA Striking against or struck by other objects, initial encounter: Secondary | ICD-10-CM | POA: Insufficient documentation

## 2020-11-12 DIAGNOSIS — S8254XA Nondisplaced fracture of medial malleolus of right tibia, initial encounter for closed fracture: Secondary | ICD-10-CM | POA: Insufficient documentation

## 2020-11-12 DIAGNOSIS — S92101A Unspecified fracture of right talus, initial encounter for closed fracture: Secondary | ICD-10-CM

## 2020-11-12 DIAGNOSIS — S99921A Unspecified injury of right foot, initial encounter: Secondary | ICD-10-CM | POA: Diagnosis present

## 2020-11-12 MED ORDER — FENTANYL CITRATE (PF) 100 MCG/2ML IJ SOLN
50.0000 ug | Freq: Once | INTRAMUSCULAR | Status: AC
  Administered 2020-11-12: 50 ug via INTRAVENOUS
  Filled 2020-11-12: qty 2

## 2020-11-12 MED ORDER — TRAMADOL HCL 50 MG PO TABS
50.0000 mg | ORAL_TABLET | Freq: Once | ORAL | Status: DC
  Filled 2020-11-12: qty 1

## 2020-11-12 MED ORDER — OXYCODONE-ACETAMINOPHEN 5-325 MG PO TABS: 1.0000 | ORAL_TABLET | ORAL | 0 refills | Status: DC | PRN

## 2020-11-12 MED ORDER — NAPROXEN 500 MG PO TABS
500.0000 mg | ORAL_TABLET | Freq: Once | ORAL | Status: DC
  Filled 2020-11-12: qty 1

## 2020-11-12 NOTE — ED Provider Notes (Signed)
[facility] Emergency Department Provider Note   ____________________________________________    I have reviewed the triage vital signs and the nursing notes.   HISTORY  Chief Complaint Ankle Injury     HPI Gary Savage is a 45 y.o. male who presents with complaints of ankle injury.  Patient reports his right ankle inverted because of metal plate got pulled out from underneath him.  Complains of significant pain primarily on the medial aspect also on the lateral aspect of his ankle.  No other injuries reported.  Received IV fentanyl and Zofran via EMS.  History reviewed. No pertinent past medical history.  There are no problems to display for this patient.   Past Surgical History:  Procedure Laterality Date   KNEE ARTHROSCOPY Left     Prior to Admission medications   Medication Sig Start Date End Date Taking? Authorizing Provider  oxyCODONE-acetaminophen (PERCOCET) 5-325 MG tablet Take 1 tablet by mouth every 4 (four) hours as needed for severe pain. 11/12/20 11/12/21 Yes Jene Every, MD  ibuprofen (ADVIL,MOTRIN) 600 MG tablet Take 1 tablet (600 mg total) by mouth every 8 (eight) hours as needed. 07/20/16   Joni Reining, PA-C     Allergies Hydrocodone and Penicillins  Family History  Problem Relation Age of Onset   Healthy Mother     Social History Social History   Tobacco Use   Smoking status: Every Day    Packs/day: 0.50    Types: Cigarettes   Smokeless tobacco: Never  Substance Use Topics   Alcohol use: Yes    Alcohol/week: 0.0 standard drinks    Comment: occasional   Drug use: No    Review of Systems    Cardiovascular: Denies chest pain. Respiratory: Denies shortness of breath. Gastrointestinal: No abdominal pain.  No nausea, no vomiting.   Genitourinary: Negative for dysuria. Musculoskeletal: As above Skin: Negative for rash. Neurological: Negative for  headaches   ____________________________________________   PHYSICAL EXAM:  VITAL SIGNS: ED Triage Vitals  Enc Vitals Group     BP 11/12/20 1341 121/89     Pulse Rate 11/12/20 1341 87     Resp 11/12/20 1341 18     Temp 11/12/20 1341 98.2 F (36.8 C)     Temp Source 11/12/20 1341 Oral     SpO2 11/12/20 1341 98 %     Weight 11/12/20 1339 77.1 kg (170 lb)     Height 11/12/20 1339 1.829 m (6')     Head Circumference --      Peak Flow --      Pain Score 11/12/20 1338 7     Pain Loc --      Pain Edu? --      Excl. in GC? --     Constitutional: Alert and oriented. No acute distress. Pleasant and interactive Eyes: Conjunctivae are normal.  Head: Atraumatic. Nose: No congestion/rhinnorhea.  Cardiovascular: Normal rate, regular rhythm. G Good peripheral circulation. Respiratory: Normal respiratory effort.  No retractions.   Genitourinary: deferred Musculoskeletal: Mild swelling of the right ankle , tenderness on the medial malleolus as well as some tenderness in the lateral malleolus, no bony abnormalities palpated.  No tenderness to the forefoot or midfoot Neurologic:  Normal speech and language. No gross focal neurologic deficits are appreciated.  Skin:  Skin is warm, dry and intact. No rash noted. Psychiatric: Mood and affect are normal. Speech and behavior are normal.  ____________________________________________   LABS (all labs ordered are listed, but only  abnormal results are displayed)  Labs Reviewed - No data to display ____________________________________________  EKG   ____________________________________________  RADIOLOGY  Ankle x-ray reviewed by me, no acute fractures noted ____________________________________________   PROCEDURES  Procedure(s) performed: yes  .Ortho Injury Treatment  Date/Time: 11/12/2020 2:39 PM Performed by: Jene Every, MD Authorized by: Jene Every, MD   Consent:    Consent obtained:  Verbal   Consent given by:   Patient   Risks discussed:  StiffnessInjury location: ankle Location details: right ankle Injury type: soft tissue Pre-procedure neurovascular assessment: neurovascularly intact Pre-procedure distal perfusion: normal Pre-procedure neurological function: normal Pre-procedure range of motion: normal Immobilization: splint Splint type: short leg Splint Applied by: ED Provider Supplies used: Ortho-Glass, elastic bandage and cotton padding Post-procedure neurovascular assessment: post-procedure neurovascularly intact Post-procedure distal perfusion: normal Post-procedure neurological function: normal Post-procedure range of motion: normal     Critical Care performed: No ____________________________________________   INITIAL IMPRESSION / ASSESSMENT AND PLAN / ED COURSE  Pertinent labs & imaging results that were available during my care of the patient were reviewed by me and considered in my medical decision making (see chart for details).   Patient presents after ankle injury as detailed above.  Ankle x-ray obtained, IV fentanyl given for significant pain.  Questionable talus fracture, CT obtained which confirms talus fracture, discussed with Dr. Excell Seltzer of podiatry who recommends splint, nonweightbearing, and outpatient follow-up in 1 week.    ____________________________________________   FINAL CLINICAL IMPRESSION(S) / ED DIAGNOSES  Final diagnoses:  Closed nondisplaced fracture of right talus, unspecified portion of talus, initial encounter        Note:  This document was prepared using Dragon voice recognition software and may include unintentional dictation errors.    Jene Every, MD 11/12/20 [phone]

## 2020-11-12 NOTE — ED Notes (Signed)
Patient to CT at this time

## 2020-11-12 NOTE — ED Triage Notes (Signed)
Pt comes into the ED via [facility] from work c/o right ankle injury after the doc-plate at work kicked up and hit his ankle.  Pt has his ankle give out on him after it was hit.  No LOC or hitting of his head when he fell to the floor.  100 mcg fentanyl and 4mg  zofran given.  18g L AC.  CBG 122

## 2020-11-20 ENCOUNTER — Other Ambulatory Visit: Payer: Self-pay | Admitting: Podiatry

## 2020-11-20 MED ORDER — OXYCODONE HCL 5 MG PO TABS: 5.0000 mg | ORAL_TABLET | ORAL | 0 refills | Status: DC | PRN

## 2021-02-04 ENCOUNTER — Ambulatory Visit: Payer: PRIVATE HEALTH INSURANCE | Attending: Podiatry | Admitting: Physical Therapy

## 2021-02-04 ENCOUNTER — Other Ambulatory Visit: Payer: Self-pay

## 2021-02-04 DIAGNOSIS — M25571 Pain in right ankle and joints of right foot: Secondary | ICD-10-CM | POA: Diagnosis not present

## 2021-02-04 DIAGNOSIS — R262 Difficulty in walking, not elsewhere classified: Secondary | ICD-10-CM | POA: Diagnosis present

## 2021-02-04 NOTE — Therapy (Addendum)
[facility] [address] [place], [address] Phone: [phone]   Fax:  [phone]  Physical Therapy Evaluation  Patient Details  Name: JT BRABEC MRN: [id number] Date of Birth: 10/09/1975 Referring Provider (PT): Rosetta Posner DPM   Encounter Date: 02/04/2021   PT End of Session - 02/04/21 1623     Visit Number 1    Number of Visits 6    Date for PT Re-Evaluation 04/15/21    Authorization - Visit Number 1    Authorization - Number of Visits 6    Progress Note Due on Visit 6    PT Start Time 1500    PT Stop Time 1545    PT Time Calculation (min) 45 min    Activity Tolerance Patient tolerated treatment well    Behavior During Therapy [facility] for tasks assessed/performed             No past medical history on file.  Past Surgical History:  Procedure Laterality Date   KNEE ARTHROSCOPY Left     There were no vitals filed for this visit.    Subjective Assessment - 02/04/21 1506     Subjective Pt reports ongoing right ankle pain that is especially bad on the right achilles tendon and medial malleolus. Unable to bring toes up to walk and must use crutches to walk.    Pertinent History Ovie Cornelio is a 45 y.o. who presents for follow-up status post right ankle sprain with comminuted fracture of talus that occurred around 10 weeks ago (DOI: 11/12/20). Patient has been nonambulatory with use of crutches and has been in a boot to the right lower extremity since last visit. Patient has been taking aspirin 81 mg twice daily and has been performing knee flexion and extension exercises daily as well as calf massages. Patient still has a fair amount of pain to the fracture sites. Patient still states that he feels tightness in his foot and ankle overall and still notes pain to the medial and lateral aspects as well as Achilles area. Patient rates his pain today at 5/10. 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 Patient is frustrated that he is still having pain.  Per note from [employer] 01/23/21    Limitations Walking;Standing    How long can you sit comfortably? N/a    How long can you stand comfortably? Indefinite amount of time as long as he stands on left    How long can you walk comfortably? Not limited but it is painful    Diagnostic tests 01/24/21 Right ankle 3 views AP, lateral, lateral oblique: previous noted   comminuted fractures of the talus less notable today. Lateral view shows   the lateral process is intact, some mild degeneration of the STJ present   as expected after injury.  Disuse osteopenia present.  Small posterior   calc spur at the insertion of the achilles tendon.  Posterior facet of STJ   and angle of gissane intact.    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    Currently in Pain? Yes    Pain Score 6     Pain Location Ankle    Pain Orientation Right    Pain Descriptors / Indicators Sharp;Dull;Shooting    Pain Type Acute pain    Pain Onset More than a month ago    Aggravating Factors  Putting pressure on ankle    Pain Relieving Factors Dramadol, Icy Hot, Iciing, Non-weight bearing    Effect of Pain on Daily Activities Difficulty walking long distances because of pain.                [facility] PT Assessment - 02/04/21 0001       Assessment   Medical Diagnosis right ankle pain    Referring Provider (PT) Rosetta Posner DPM    Onset Date/Surgical Date 11/12/20    Prior Therapy No      Home Environment   Living Environment Private residence    Type of Home House    Home Access Stairs to enter    Home Layout Two level      Prior Function   Level of Independence Independent      Cognition   Overall Cognitive Status Within Functional Limits for tasks assessed            ANKLE EVALUATION    OBJECTIVE  VITALS: 122/82 HR 106    MUSCULOSKELETAL: Tremor: Absent Bulk: Decreased right  calf compared to left. Swelling on medial and lateral aspects of right ankle   Posture No gross abnormalities noted in seated or standing posture  Gait    Use of crutches to offload right ankle   Palpation Pain to palpation along medial and lateral malleoli.Achilles tendon to palpation  Strength R/L NT Hip flexion NT Hip external rotation NT Hip internal rotation NT Hip extension  NT Hip abduction NT Hip adduction NT Knee extension NT Knee flexion 4/5 Ankle Plantarflexion 3/5 Ankle Dorsiflexion 3/5 Ankle Inversion 3/5 Ankle Eversion  AROM R/L 15*/50 Ankle Plantarflexion 5/20 Ankle Dorsiflexion 5*/35 Ankle Inversion 5*/25 Ankle Eversion *Indicates Pain  PROM R/L 20*/50 Ankle Plantarflexion 5/20 Ankle Dorsiflexion 35*/35 Ankle Inversion 15/15 Ankle Eversion *Indicates Pain  Passive Accessory Motion Superior Tibiofibular Joint: NT  Inferior Tibiofibular Joint: NT  Talocrural Joint Distraction: NT  Talocrural Joint AP: Restricted  Talocrural Joint PA: Restricted  Sensation Grossly intact to light touch bilateral LEs as determined by testing dermatomes L2-S2 Proprioception and hot/cold testing deferred on this date      SPECIAL TESTS  Ligamentous Integrity Anterior Drawer (ATF, 10-15 plantarflexion with anterior translation): Not done Talar Tilt (CFL, inversion): Not done Eversion Stress Test (Deltoid, eversion): Not done External Rotation Test (High ankle, dorsiflexion and external rotation): Not done Squeeze Test (High ankle): Not done Impingment Sign (Dorsiflexion and eversion): Not done  Achilles Integrity Thompson Test: Not done   THEREX:  Long sitting ankle inversion with GTB 1 x 10  Long sitting ankle eversion with GTB 1 x 10  Long sitting ankle dorsiflexion with GTB 1 x 5 -Pt reports that exercise is too painful   Long sitting ankle isometric dorsiflexion 1 x 5 x 5 sec holds   HEP includes the following:   Access Code:  [email or web address] URL: [email or web address] Date: 02/04/2021 Prepared by: Ellin Goodie  Exercises Long Sitting Ankle Eversion with Resistance - 1 x daily - 7 x weekly - 3 sets - 10 reps Long Sitting Ankle Inversion with Resistance - 1 x daily - 7 x weekly - 3 sets - 10 reps Isometric Ankle Dorsiflexion and Plantarflexion - 1 x daily - 7 x weekly - 3 sets - 5 reps - 5 hold          PT Short Term Goals - 02/04/21 1610       PT SHORT TERM GOAL #1   Title Patient will demonstrate understanding  of home exercise plan for independent management of symptoms.    Baseline 10/17: NT    Time 2    Period Weeks    Status New      PT SHORT TERM GOAL #2   Title Patient will demonstrate an improvement in right ankle strength by >=1 MMT grade to demonstrate increased right ankle stability.    Baseline 10/17: R ankle Inv/Ev/DF 3/5    Time 2    Period Weeks    Status New    Target Date 02/18/21               PT Long Term Goals - 02/04/21 1613       PT LONG TERM GOAL #1   Title Patient will have improved function and activity level as evidenced by an increase in FOTO score by 10 points or more.    Baseline 10/17: 33/55    Time 10    Period Weeks    Target Date 04/15/21      PT LONG TERM GOAL #2   Title Patient will demonstrate an increase in right ankle strength that is symmetrical to left ankle for increase stability when performing job duties.    Baseline 10/17: R Ankle PF/DF/Inv/Ev 3/5    Time 10    Period Weeks    Status New    Target Date 04/15/21      PT LONG TERM GOAL #3   Title Patient will demonstrate symmetrical step length and heel strike on RLE and LLE without use of AD to demonstrate normalized gait.    Baseline 10/17: Use of crutches and unable to weight bear through RLE    Time 10    Period Weeks    Status New    Target Date 04/15/21                    Plan - 02/04/21 1558     Clinical Impression Statement Pt is a 45 yo white male  that presents 12 weeks s/p right talus avulsion fracture and ankle sprain. He presents with decreased right ankle ROM and strength and increase right ankle pain with weight bearing. 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    Examination-Activity Limitations Locomotion Level;Lift;Carry;Stairs;Stand;Squat    Examination-Participation Restrictions Occupation    Stability/Clinical Decision Making Stable/Uncomplicated    Clinical Decision Making Low    Rehab Potential Excellent    PT Frequency 2x / week    PT Duration Other (comment)   10   PT Treatment/Interventions Joint Manipulations;Moist Heat;Traction;Electrical Stimulation;Balance training;Neuromuscular re-education;Therapeutic exercise;Therapeutic activities;Gait training;Passive range of motion;Dry needling;Manual techniques;Cryotherapy;DME Instruction    PT Next Visit Plan Assess knee and hip strength. Standing balance    PT Home Exercise Plan [email or web address]    Consulted and Agree with Plan of Care Patient             Patient will benefit from skilled therapeutic intervention in order to improve the following deficits and impairments:  Abnormal gait, Decreased range of motion, Difficulty walking, Pain, Hypomobility, Increased edema, Decreased strength  Visit Diagnosis: Pain in right ankle and joints of right foot - Plan: PT plan of care cert/re-cert  Difficulty in walking, not elsewhere classified - Plan: PT plan of care cert/re-cert     Problem List There are no problems to display for this patient.  Ellin Goodie PT, DPT  02/04/2021, 4:25 PM  [facility] [address], [place], [address] Phone: [phone]   Fax:  [phone]  Name: DAYMIAN LILL MRN: [id number] Date of Birth: 14-Jan-1976

## 2021-02-07 ENCOUNTER — Ambulatory Visit: Payer: PRIVATE HEALTH INSURANCE | Attending: Podiatry | Admitting: Physical Therapy

## 2021-02-07 DIAGNOSIS — R262 Difficulty in walking, not elsewhere classified: Secondary | ICD-10-CM | POA: Diagnosis present

## 2021-02-07 DIAGNOSIS — M25571 Pain in right ankle and joints of right foot: Secondary | ICD-10-CM | POA: Diagnosis not present

## 2021-02-07 NOTE — Therapy (Signed)
[facility] [address] [place], [address] Phone: [phone]   Fax:  [phone]  Physical Therapy Treatment  Patient Details  Name: Gary Savage MRN: [id number] Date of Birth: [birth year] Referring Provider (PT): Rosetta Posner DPM   Encounter Date: 02/07/2021   PT End of Session - 02/07/21 1634     Visit Number 2    Number of Visits 6    Date for PT Re-Evaluation 04/15/21    Authorization - Visit Number 2    Authorization - Number of Visits 6    Progress Note Due on Visit 6    PT Start Time 1620    PT Stop Time 1705    PT Time Calculation (min) 45 min    Activity Tolerance Patient tolerated treatment well    Behavior During Therapy [facility] for tasks assessed/performed             No past medical history on file.  Past Surgical History:  Procedure Laterality Date   KNEE ARTHROSCOPY Left     There were no vitals filed for this visit.   THEREX:   ROM   AROM R DF: 7 deg  PROM R DF: 13 deg  Long Sitting Gastroc Stretch with strap 2 x 30 sec  -Pt reports increased pain on lateral side of left foot   Standing Gastroc Stretch 2 x 30 sec   Standing Eccentric Heel Raises with BUE support 2 x 10 -min VC to decrease forward lean and maintain upright posture    Modified Single Leg Stance on [facility]one 2 x 30 sec -min VC to maintain toes pointed forward to prevent ER of ankle     Education using RICE to decrease swelling.   Palpation- allodynia around entire surface of ankle especially achilles tendon    Updated HEP and educated patient on changes to exercises and addition of new exercises standing heel raises , modified SLS on water bottle, and standing gastroc stretch.         PT Education - 02/07/21 1627     Education Details form/technique for appropriate exercise    Person(s) Educated Patient    Methods Explanation;Demonstration;Tactile cues;Verbal cues    Comprehension  Verbalized understanding;Returned demonstration;Verbal cues required              PT Short Term Goals - 02/04/21 1610       PT SHORT TERM GOAL #1   Title Patient will demonstrate understanding of home exercise plan for independent management of symptoms.    Baseline 10/17: NT    Time 2    Period Weeks    Status New      PT SHORT TERM GOAL #2   Title Patient will demonstrate an improvement in right ankle strength by >=1 MMT grade to demonstrate increased right ankle stability.    Baseline 10/17: R ankle Inv/Ev/DF 3/5    Time 2    Period Weeks    Status New    Target Date 02/18/21               PT Long Term Goals - 02/04/21 1613       PT LONG TERM GOAL #1   Title Patient will have improved function and activity level as evidenced by an increase in FOTO score by 10 points or more.    Baseline 10/17: 33/55    Time 10    Period Weeks    Target Date 04/15/21  PT LONG TERM GOAL #2   Title Patient will demonstrate an increase in right ankle strength that is symmetrical to left ankle for increase stability when performing job duties.    Baseline 10/17: R Ankle PF/DF/Inv/Ev 3/5    Time 10    Period Weeks    Status New    Target Date 04/15/21      PT LONG TERM GOAL #3   Title Patient will demonstrate symmetrical step length and heel strike on RLE and LLE without use of AD to demonstrate normalized gait.    Baseline 10/17: Use of crutches and unable to weight bear through RLE    Time 10    Period Weeks    Status New    Target Date 04/15/21                   Plan - 02/07/21 1635     Clinical Impression Statement Pt presents for f/u for right talus avulsion fracture and potential ankle sprain. Pt continues to experience allodynia around entire right ankle and pain with movement that continues to limit his activity. He exhibits decrease dorsiflexion with restriction with PROM likely from shortened left gastroc from prolonged use of cast. He was able to  tolerate all standing exercises and he has transitioned to walking without cruthces. PT concerned about pt's ability to manage chronic pain, and it's limiting affect on his movement. Pt is currently seeking further medical intervention for pain management, but he is extremtly frustrated to date. 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    Examination-Activity Limitations Locomotion Level;Lift;Carry;Stairs;Stand;Squat    Examination-Participation Restrictions Occupation    Stability/Clinical Decision Making Stable/Uncomplicated    Rehab Potential Excellent    PT Frequency 2x / week    PT Duration Other (comment)   10   PT Treatment/Interventions Joint Manipulations;Moist Heat;Traction;Electrical Stimulation;Balance training;Neuromuscular re-education;Therapeutic exercise;Therapeutic activities;Gait training;Passive range of motion;Dry needling;Manual techniques;Cryotherapy;DME Instruction    PT Next Visit Plan Progress standing strengthening exercises    PT Home Exercise Plan [email or web address]    Consulted and Agree with Plan of Care Patient            HEP includes the following:  Access Code: [email or web address] URL: [email or web address] Date: 02/07/2021 Prepared by: Ellin Goodie  Exercises Isometric Ankle Dorsiflexion and Plantarflexion - 1 x daily - 7 x weekly - 3 sets - 5 reps - 5 hold Heel Raises with Counter Support - 1 x daily - 3 x weekly - 3 sets - 10 reps Long Sitting Calf Stretch with Strap - 1 x daily - 7 x weekly - 1 sets - 5 reps - 30 hold Modified Single Leg Stance on Cone 5 x 30 sec x 7 days per week   Patient will benefit from skilled therapeutic intervention in order to improve the following deficits and impairments:  Abnormal gait, Decreased range of motion, Difficulty walking, Pain, Hypomobility, Increased edema, Decreased strength  Visit  Diagnosis: Pain in right ankle and joints of right foot  Difficulty in walking, not elsewhere classified     Problem List There are no problems to display for this patient.   Johnn Hai, PT 02/07/2021, 5:31 PM  [facility] [address], [place], [address] Phone: [phone]   Fax:  [phone]  Name: Gary Savage MRN: [id number] Date of  Birth: 1976-02-19

## 2021-02-11 ENCOUNTER — Ambulatory Visit: Payer: BC Managed Care – PPO | Admitting: Physical Therapy

## 2021-02-13 ENCOUNTER — Ambulatory Visit: Payer: PRIVATE HEALTH INSURANCE | Admitting: Physical Therapy

## 2021-02-13 DIAGNOSIS — M25571 Pain in right ankle and joints of right foot: Secondary | ICD-10-CM | POA: Diagnosis not present

## 2021-02-13 DIAGNOSIS — R262 Difficulty in walking, not elsewhere classified: Secondary | ICD-10-CM

## 2021-02-13 NOTE — Therapy (Signed)
[facility] [address] [place], [address] Phone: [phone]   Fax:  [phone]  Physical Therapy Treatment  Patient Details  Name: Gary Savage MRN: [id number] Date of Birth: Mar 18, 1976 Referring Provider (PT): Rosetta Posner DPM   Encounter Date: 02/13/2021   PT End of Session - 02/13/21 1417     Visit Number 3    Number of Visits 6    Date for PT Re-Evaluation 04/15/21    Authorization - Visit Number 3    Authorization - Number of Visits 6    Progress Note Due on Visit 6    PT Start Time 1330    PT Stop Time 1415    PT Time Calculation (min) 45 min    Activity Tolerance Patient tolerated treatment well    Behavior During Therapy [facility] for tasks assessed/performed             No past medical history on file.  Past Surgical History:  Procedure Laterality Date   KNEE ARTHROSCOPY Left     There were no vitals filed for this visit.   Subjective Assessment - 02/13/21 1331     Subjective Pt reports that he is feeling increased swelling and pain in the right ankle since last session. Patient had a good day on Friday, but he experienced a significant pain increase a night despite not increasing his activity throughout day.    Pertinent History Gary Savage is a 45 y.o. who presents for follow-up status post right ankle sprain with comminuted fracture of talus that occurred around 10 weeks ago (DOI: 11/12/20). Patient has been nonambulatory with use of crutches and has been in a boot to the right lower extremity since last visit. Patient has been taking aspirin 81 mg twice daily and has been performing knee flexion and extension exercises daily as well as calf massages. Patient still has a fair amount of pain to the fracture sites. Patient still states that he feels tightness in his foot and ankle overall and still notes pain to the medial and lateral aspects as well as Achilles area. Patient rates  his pain today at 5/10. 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 Patient is frustrated that he is still having pain.  Per note from [employer] 01/23/21    Limitations Walking;Standing    How long can you sit comfortably? N/a    How long can you stand comfortably? Indefinite amount of time as long as he stands on left    How long can you walk comfortably? Not limited but it is painful    Diagnostic tests 01/24/21 Right ankle 3 views AP, lateral, lateral oblique: previous noted   comminuted fractures of the talus less notable today. Lateral view shows   the lateral process is intact, some mild degeneration of the STJ present   as expected after injury.  Disuse osteopenia present.  Small posterior   calc spur at the insertion of the achilles tendon.  Posterior facet of STJ   and angle of gissane intact.    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    Currently in Pain? Yes    Pain Score 7     Pain Location Ankle    Pain Orientation Right    Pain Descriptors / Indicators Sharp;Dull;Shooting    Pain Type Acute pain    Pain Onset More than a  month ago            THEREX   ANKLE ROM completed at beginning of session   R Ankle  AROM    DF: -7 deg  PF: 40 deg   R Ankle PROM   DF: 0 deg  PF: 45 deg   L Ankle AROM   DF: 20 deg  PF: 40 deg   L Ankle PROM   DF: 25 deg  PF: 45 deg   Squats with Focus on Dorsiflexion 2 x 10  Forward Lunges with forward lean for increased dorsiflexion and BUE on wall 2 x 10  -Pt reports increase pain over talus on RLE, but able to tolerate with use of BUE support   MANUAL THERAPY   Posterior Talar Glide: Mobilization with Movement 3 x 10 with Grade III to IV glides  -Pt reports increased pain over talus   Posterior Talar Glide: Mobilization with Movement  3 x 10 with Grade III to IV glides  -Pt reports increased pain over talus   Talocrural Distraction 30 sec x 6    Updated HEP  and educated patient on changes to exercises and addition of new exercises squats and forward lunges.                           PT Education - 02/13/21 1432     Education Details form/technique for appropriate exercise    Person(s) Educated Patient    Methods Explanation;Demonstration;Verbal cues;Handout    Comprehension Verbalized understanding;Returned demonstration;Verbal cues required              PT Short Term Goals - 02/13/21 1416       PT SHORT TERM GOAL #1   Title Patient will demonstrate understanding of home exercise plan for independent management of symptoms.    Baseline 10/17: NT    Time 2    Period Weeks    Status On-going      PT SHORT TERM GOAL #2   Title Patient will demonstrate an improvement in right ankle strength by >=1 MMT grade to demonstrate increased right ankle stability.    Baseline 10/17: R ankle Inv/Ev/DF 3/5    Time 2    Period Weeks    Status On-going    Target Date 02/18/21               PT Long Term Goals - 02/13/21 1416       PT LONG TERM GOAL #1   Title Patient will have improved function and activity level as evidenced by an increase in FOTO score by 10 points or more.    Baseline 10/17: 33/55    Time 10    Period Weeks    Status On-going      PT LONG TERM GOAL #2   Title Patient will demonstrate an increase in right ankle strength that is symmetrical to left ankle for increase stability when performing job duties.    Baseline 10/17: R Ankle PF/DF/Inv/Ev 3/5    Time 10    Period Weeks    Status On-going      PT LONG TERM GOAL #3   Title Patient will demonstrate symmetrical step length and heel strike on RLE and LLE without use of AD to demonstrate normalized gait.    Baseline 10/17: Use of crutches and unable to weight bear through RLE 10/26: Decrease stance time on RLE    Time 10    Period  Weeks    Status On-going                Plan - 02/13/21 1418     Clinical Impression Statement  Pt presents for f/u for right avulsion fracture of talus and potential ankle strain and sprain. Pt continues to demonstrate limited dorsiflexion ROM with a hard end feel indicating possible boney restriction. He did exhibit some improvement in right ankle dorsiflexion between pre and post session following manipulations and exercises, but it is unclear about how long this increase in ROM will last. Pt does experience increased pain with AROM dorsiflexion in talus in weight bearing position. He continues to describe having poor sleep because of uncontrolled right ankle pain at night that he is seeking further medical care to manage.  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    Examination-Activity Limitations Locomotion Level;Lift;Carry;Stairs;Stand;Squat    Stability/Clinical Decision Making Stable/Uncomplicated    Clinical Decision Making Low    Rehab Potential Excellent    PT Frequency 2x / week    PT Duration 6 weeks    PT Treatment/Interventions Joint Manipulations;Moist Heat;Traction;Electrical Stimulation;Balance training;Neuromuscular re-education;Therapeutic exercise;Therapeutic activities;Gait training;Passive range of motion;Dry needling;Manual techniques;Cryotherapy;DME Instruction    PT Next Visit Plan Teach patient dorsiflexion self-mobilization and continue with closed chain anterior weight shift oriented exercises to promote dorsiflexion    PT Home Exercise Plan [email or web address]    Consulted and Agree with Plan of Care Patient            HEP includes the following:  Access Code: [email or web address] URL: [email or web address] Date: 02/13/2021 Prepared by: Ellin Goodie  Exercises Heel Raises with Counter Support - 1 x daily - 3 x weekly - 3 sets - 10 reps Long Sitting Calf Stretch with Strap - 1 x daily - 7 x weekly - 1 sets - 5 reps - 30 hold Mini Squat  - 1 x daily - 3 x weekly - 3 sets - 10 reps Forward Fall Out Lunge - 1 x daily - 3 x weekly - 3 sets - 10 reps  Patient will benefit from skilled therapeutic intervention in order to improve the following deficits and impairments:  Abnormal gait, Decreased range of motion, Difficulty walking, Pain, Hypomobility, Increased edema, Decreased strength  Visit Diagnosis: Pain in right ankle and joints of right foot  Difficulty in walking, not elsewhere classified     Problem List There are no problems to display for this patient.  Ellin Goodie PT, DPT  02/13/2021, 2:42 PM   [facility] PHYSICAL AND SPORTS MEDICINE [address] [place], [address] Phone: [phone]   Fax:  [phone]  Name: ORLANDO DEVEREUX MRN: [id number] Date of Birth: Apr 25, 1975

## 2021-02-18 ENCOUNTER — Ambulatory Visit: Payer: PRIVATE HEALTH INSURANCE | Admitting: Physical Therapy

## 2021-02-18 DIAGNOSIS — M25571 Pain in right ankle and joints of right foot: Secondary | ICD-10-CM | POA: Diagnosis not present

## 2021-02-18 DIAGNOSIS — R262 Difficulty in walking, not elsewhere classified: Secondary | ICD-10-CM

## 2021-02-18 NOTE — Therapy (Signed)
[facility] [address], [place], [address] Phone: [phone]   Fax:  [phone]  Physical Therapy Treatment  Patient Details  Name: Gary Savage MRN: [id number] Date of Birth: November 12, 1975 Referring Provider (PT): Rosetta Posner DPM   Encounter Date: 02/18/2021   PT End of Session - 02/18/21 1639     Visit Number 4    Number of Visits 6    Date for PT Re-Evaluation 04/15/21    Authorization - Visit Number 4    Authorization - Number of Visits 6    Progress Note Due on Visit 6    PT Start Time 1630    PT Stop Time 1715    PT Time Calculation (min) 45 min    Activity Tolerance Patient tolerated treatment well    Behavior During Therapy [facility] for tasks assessed/performed             No past medical history on file.  Past Surgical History:  Procedure Laterality Date   KNEE ARTHROSCOPY Left     There were no vitals filed for this visit.   Subjective Assessment - 02/18/21 1631     Subjective Pt reports that he has been excruciating pain in his right ankle since Wednesday especially in right ankle. He is awaiting to speak with case manager after he sees podiatrist.    Pertinent History Gary Savage is a 45 y.o. who presents for follow-up status post right ankle sprain with comminuted fracture of talus that occurred around 10 weeks ago (DOI: 11/12/20). Patient has been nonambulatory with use of crutches and has been in a boot to the right lower extremity since last visit. Patient has been taking aspirin 81 mg twice daily and has been performing knee flexion and extension exercises daily as well as calf massages. Patient still has a fair amount of pain to the fracture sites. Patient still states that he feels tightness in his foot and ankle overall and still notes pain to the medial and lateral aspects as well as Achilles area. Patient rates his pain today at 5/10. 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 Patient is frustrated that he is still having pain.  Per note from [employer] 01/23/21    Limitations Walking;Standing    How long can you sit comfortably? N/a    How long can you stand comfortably? Indefinite amount of time as long as he stands on left    How long can you walk comfortably? Not limited but it is painful    Diagnostic tests 01/24/21 Right ankle 3 views AP, lateral, lateral oblique: previous noted   comminuted fractures of the talus less notable today. Lateral view shows   the lateral process is intact, some mild degeneration of the STJ present   as expected after injury.  Disuse osteopenia present.  Small posterior   calc spur at the insertion of the achilles tendon.  Posterior facet of STJ   and angle of gissane intact.    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    Currently in Pain? Yes    Pain Score 8     Pain Location Ankle    Pain Orientation Right    Pain Descriptors / Indicators Sharp;Dull;Shooting    Pain Type Acute pain    Pain Onset More than a month ago  THEREX   Educated pt on how to use TENS until for ankle and placement of pads.   BAPS board Level PF/DF 3 x 10   Education on pre-surgery questions to ask surgeon about what are next steps after conservative treatment.   Review of podiatrist's recent notes about progress with ankle.    MANUAL THERAPY  Right dorsiflexion stretch  Metatarsal mobilization   Ankle Edema Massage  Achilles tendon massage and comparison of RLE and LLE          PT Education - 02/18/21 1639     Education Details form/technique for appropriate exercise    Person(s) Educated Patient    Methods Explanation;Demonstration;Verbal cues;Handout    Comprehension Returned demonstration;Verbal cues required;Verbalized understanding              PT Short Term Goals - 02/18/21 1643       PT SHORT TERM GOAL #1   Title Patient will  demonstrate understanding of home exercise plan for independent management of symptoms.    Baseline 10/17: NT    Time 2    Period Weeks    Status On-going      PT SHORT TERM GOAL #2   Title Patient will demonstrate an improvement in right ankle strength by >=1 MMT grade to demonstrate increased right ankle stability.    Baseline 10/17: R ankle Inv/Ev/DF 3/5    Time 2    Period Weeks    Status On-going    Target Date 02/18/21               PT Long Term Goals - 02/18/21 1643       PT LONG TERM GOAL #1   Title Patient will have improved function and activity level as evidenced by an increase in FOTO score by 10 points or more.    Baseline 10/17: 33/55    Time 10    Period Weeks    Status On-going                   Plan - 02/18/21 1823     Clinical Impression Statement Pt presents for f/u for right avulsion fracture of talus and peroneal tendon tear. He continues to exhibit limited progress with ankle mobility and strength, because of uncontrolled pain. Pt will follow-up with podiatrist about limited progress to determine next best course of action now that physical therapy intervention treatments have been exhausted. PT instructed pt on pain modulation strategies to better manage his pain, so that he can perform exercises.    Examination-Activity Limitations Locomotion Level;Lift;Carry;Stairs;Stand;Squat    Stability/Clinical Decision Making Stable/Uncomplicated    Rehab Potential Excellent    PT Frequency 2x / week    PT Duration 6 weeks    PT Treatment/Interventions Joint Manipulations;Moist Heat;Traction;Electrical Stimulation;Balance training;Neuromuscular re-education;Therapeutic exercise;Therapeutic activities;Gait training;Passive range of motion;Dry needling;Manual techniques;Cryotherapy;DME Instruction    PT Next Visit Plan Teach patient dorsiflexion self-mobilization and continue with closed chain anterior weight shift oriented exercises to promote  dorsiflexion    PT Home Exercise Plan [email or web address]    Consulted and Agree with Plan of Care Patient             Patient will benefit from skilled therapeutic intervention in order to improve the following deficits and impairments:  Abnormal gait, Decreased range of motion, Difficulty walking, Pain, Hypomobility, Increased edema, Decreased strength  Visit Diagnosis: No diagnosis found.     Problem List There are no problems to display for this patient.  Ellin Goodie  PT, DPT  02/18/2021, 6:36 PM  [place] [facility] PHYSICAL AND SPORTS MEDICINE [address], [place], [address] Phone: [phone]   Fax:  [phone]  Name: Gary Savage MRN: [id number] Date of Birth: 01-20-1976

## 2021-02-21 ENCOUNTER — Encounter: Payer: Self-pay | Admitting: Physical Therapy

## 2021-02-21 ENCOUNTER — Ambulatory Visit: Payer: PRIVATE HEALTH INSURANCE | Attending: Podiatry | Admitting: Physical Therapy

## 2021-02-21 DIAGNOSIS — M25571 Pain in right ankle and joints of right foot: Secondary | ICD-10-CM | POA: Insufficient documentation

## 2021-02-21 DIAGNOSIS — R262 Difficulty in walking, not elsewhere classified: Secondary | ICD-10-CM | POA: Diagnosis present

## 2021-02-21 NOTE — Therapy (Signed)
[place] [facility] [address] [place], [address] Phone: [phone]   Fax:  [phone]  Physical Therapy Treatment  Patient Details  Name: RAVI TUCCILLO MRN: [id number] Date of Birth: 1975/11/08 Referring Provider (PT): Rosetta Posner DPM   Encounter Date: 02/21/2021    History reviewed. No pertinent past medical history.  Past Surgical History:  Procedure Laterality Date   KNEE ARTHROSCOPY Left     There were no vitals filed for this visit.   Subjective Assessment - 02/21/21 1557     Subjective Pt reports that he continues to experience pain in his right ankle. He saw podiatrist and he was advised to get surgery considering conservative measures failed. Pt wants to continue with activity until he sees another doctor for a second opinion. He has reduced his activity but he is still experiencing an increase in the pain since last week.    Pertinent History Tel Hevia is a 45 y.o. who presents for follow-up status post right ankle sprain with comminuted fracture of talus that occurred around 10 weeks ago (DOI: 11/12/20). Patient has been nonambulatory with use of crutches and has been in a boot to the right lower extremity since last visit. Patient has been taking aspirin 81 mg twice daily and has been performing knee flexion and extension exercises daily as well as calf massages. Patient still has a fair amount of pain to the fracture sites. Patient still states that he feels tightness in his foot and ankle overall and still notes pain to the medial and lateral aspects as well as Achilles area. Patient rates his pain today at 5/10. 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 Patient is frustrated that he is still having pain.  Per note from [employer] 01/23/21    Limitations Walking;Standing    How long can you sit comfortably? N/a    How long can you stand comfortably? Indefinite amount of  time as long as he stands on left    How long can you walk comfortably? Not limited but it is painful    Diagnostic tests 01/24/21 Right ankle 3 views AP, lateral, lateral oblique: previous noted   comminuted fractures of the talus less notable today. Lateral view shows   the lateral process is intact, some mild degeneration of the STJ present   as expected after injury.  Disuse osteopenia present.  Small posterior   calc spur at the insertion of the achilles tendon.  Posterior facet of STJ   and angle of gissane intact.    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    Pain Onset More than a month ago              THEREX:   Squats Using TRX for BUE support 1 x 10  -Pt reports no increase in ankle pian   Forward and backwards lunges with LLE as stance leg 1 x 10 -Pt terminates activity due to pain.   Calf Raises- Up on 2 and down on 1 x 10 -Pt reports increased pain   Single Leg Stance on RLE with EC 30 sec x 4  Discussion of plan of care and whether to continue PT.  -Waiting on apt with Dr. Ether Griffins for second opinion for whether surgery is warranted.    MANUAL THERAPY   Edema massage and soft tissue over peroneal and achilles tendon.  No updates to home exercise plan from last session.            PT Short Term Goals - 02/21/21 1641       PT SHORT TERM GOAL #1   Title Patient will demonstrate understanding of home exercise plan for independent management of symptoms.    Baseline 10/17: NT    Time 2    Period Weeks    Status On-going      PT SHORT TERM GOAL #2   Title Patient will demonstrate an improvement in right ankle strength by >=1 MMT grade to demonstrate increased right ankle stability.    Baseline 10/17: R ankle Inv/Ev/DF 3/5    Time 2    Period Weeks    Status On-going    Target Date 02/18/21               PT Long Term Goals - 02/21/21 1641       PT LONG TERM GOAL #1   Title Patient will have  improved function and activity level as evidenced by an increase in FOTO score by 10 points or more.    Baseline 10/17: 33/55    Time 10    Period Weeks    Status On-going                   Plan - 02/21/21 1638     Clinical Impression Statement Pt presents for f/u for right avulsion fracture of talus and peroneal tendon tear. Pt unable to perform most exercises especially anterior and posterior of ankle in closed chain because of increase pain over peroneal and achilles tendon. He is attending a podiatry apt next week for a second opinion and he will report back if he wants to continue with PT. At this point, it appears as if pt is not responding to conservative treatment for right ankle and that he would benefit more from medical management. Pt given home exercise plan with exercises that are modified to point where he can perform within tolerable range of pain.    Examination-Activity Limitations Locomotion Level;Lift;Carry;Stairs;Stand;Squat    Stability/Clinical Decision Making Stable/Uncomplicated    Clinical Decision Making Low    Rehab Potential Excellent    PT Frequency 2x / week    PT Duration 6 weeks    PT Treatment/Interventions Joint Manipulations;Moist Heat;Traction;Electrical Stimulation;Balance training;Neuromuscular re-education;Therapeutic exercise;Therapeutic activities;Gait training;Passive range of motion;Dry needling;Manual techniques;Cryotherapy;DME Instruction    PT Next Visit Plan Teach patient dorsiflexion self-mobilization and continue with closed chain anterior weight shift oriented exercises to promote dorsiflexion    PT Home Exercise Plan [email or web address]    Consulted and Agree with Plan of Care Patient             Patient will benefit from skilled therapeutic intervention in order to improve the following deficits and impairments:  Abnormal gait, Decreased range of motion, Difficulty walking, Pain, Hypomobility, Increased edema, Decreased strength  Visit  Diagnosis: Pain in right ankle and joints of right foot  Difficulty in walking, not elsewhere classified     Problem List There are no problems to display for this patient.  Ellin Goodie PT, DPT  02/21/2021, 4:43 PM  [place] [facility] PHYSICAL AND SPORTS MEDICINE [address], [place], [address] Phone: [phone]   Fax:  [phone]  Name: KEYTON BHAT MRN: [id number] Date of Birth: 1975-10-20

## 2021-02-25 ENCOUNTER — Telehealth: Payer: Self-pay | Admitting: Physical Therapy

## 2021-02-25 ENCOUNTER — Ambulatory Visit: Payer: PRIVATE HEALTH INSURANCE | Admitting: Physical Therapy

## 2021-02-25 NOTE — Telephone Encounter (Signed)
Attempted to call patient about absence. Did not reach so left VM to call back to reschedule.

## 2021-02-28 ENCOUNTER — Ambulatory Visit: Payer: PRIVATE HEALTH INSURANCE | Admitting: Physical Therapy

## 2021-02-28 DIAGNOSIS — M25571 Pain in right ankle and joints of right foot: Secondary | ICD-10-CM

## 2021-02-28 DIAGNOSIS — R262 Difficulty in walking, not elsewhere classified: Secondary | ICD-10-CM

## 2021-02-28 NOTE — Therapy (Signed)
[facility] [address], [place], [address] Phone: [phone]   Fax:  [phone]  Physical Therapy Treatment  Patient Details  Name: Gary Savage MRN: [id number] Date of Birth: 1975/09/16 Referring Provider (PT): Caroline More DPM   Encounter Date: 02/28/2021   PT End of Session - 02/28/21 1704     Visit Number 6    Number of Visits 6    Date for PT Re-Evaluation 04/15/21    Authorization - Visit Number 5    Authorization - Number of Visits 6    Progress Note Due on Visit 6    PT Start Time 8675    PT Stop Time 1630    PT Time Calculation (min) 45 min    Activity Tolerance Patient tolerated treatment well    Behavior During Therapy [facility] for tasks assessed/performed             No past medical history on file.  Past Surgical History:  Procedure Laterality Date   KNEE ARTHROSCOPY Left     There were no vitals filed for this visit.   Subjective Assessment - 02/28/21 1547     Subjective Pt reports that he was instructed by podiatrist who gave him a second opinion to continue with PT and to also try pain clinic. Podiatrist fears that his pain is becoming centralized.    Pertinent History Gary Savage is a 45 y.o. who presents for follow-up status post right ankle sprain with comminuted fracture of talus that occurred around 10 weeks ago (DOI: 11/12/20). Patient has been nonambulatory with use of crutches and has been in a boot to the right lower extremity since last visit. Patient has been taking aspirin 81 mg twice daily and has been performing knee flexion and extension exercises daily as well as calf massages. Patient still has a fair amount of pain to the fracture sites. Patient still states that he feels tightness in his foot and ankle overall and still notes pain to the medial and lateral aspects as well as Achilles area. Patient rates his pain today at 5/10. 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 Patient is frustrated that he is still having pain.  Per note from [employer] 01/23/21    Limitations Walking;Standing    How long can you sit comfortably? N/a    How long can you stand comfortably? Indefinite amount of time as long as he stands on left    How long can you walk comfortably? Not limited but it is painful    Diagnostic tests 01/24/21 Right ankle 3 views AP, lateral, lateral oblique: previous noted   comminuted fractures of the talus less notable today. Lateral view shows   the lateral process is intact, some mild degeneration of the STJ present   as expected after injury.  Disuse osteopenia present.  Small posterior   calc spur at the insertion of the achilles tendon.  Posterior facet of STJ   and angle of gissane intact.    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    Currently in Pain? Yes    Pain Score 4     Pain Location Ankle    Pain Orientation Right    Pain Descriptors / Indicators Sharp;Dull;Shooting    Pain Type Acute pain    Pain Onset More than a month ago  THEREX:     Ankle Figure 8: R 20 inch L 19.5   Lateral Weight Shifts 3 x 10  Anterior Posterior Weight Shifts 3 x 10  -Float right heel when it is planted to increase PF   Ankle ABCs 1 x 25  Ankle Circles 1 x 25    Ankle Strength   R Ankle PF 5/5 DF 3/5, EV 3/5 L Ankle PF 5/5 DF 5/5 EV 5/5   Ankle ROM   AROM  R DF 10  (Restriction due to pain) L DF 20   PROM   R DF 10 (Bony Restriction Noted) L DF 20            PT Education - 02/28/21 1703     Education Details form and technique for appropriate exercise and explanation of plan of care based on podiatry note    Person(s) Educated Patient    Methods Explanation;Demonstration;Verbal cues;Handout    Comprehension Returned demonstration;Verbal cues required;Verbalized understanding              PT Short Term Goals - 02/28/21  1604       PT SHORT TERM GOAL #1   Title Patient will demonstrate understanding of home exercise plan for independent management of symptoms.    Baseline 10/17: NT  11/10: Unable to consistently perform    Time 2    Period Weeks    Status Partially Met      PT SHORT TERM GOAL #2   Title Patient will demonstrate an improvement in right ankle strength by >=1 MMT grade to demonstrate increased right ankle stability.    Baseline 10/17: R ankle Inv/Ev/DF 3/5  11/10:  R Ankle PF 5/5 DF 3/5, EV 3/5    Time 2    Period Weeks    Status Partially Met    Target Date 02/18/21               PT Long Term Goals - 02/28/21 1550       PT LONG TERM GOAL #1   Title Patient will have improved function and activity level as evidenced by an increase in FOTO score by 10 points or more.    Baseline 10/17: 33/55 11/10: 47/55    Time 10    Period Weeks    Status Partially Met      PT LONG TERM GOAL #2   Title Patient will demonstrate an increase in right ankle strength that is symmetrical to left ankle for increase stability when performing job duties.    Baseline 10/17: R Ankle PF/DF/Inv/Ev 3/5  11/10:  R Ankle PF 5/5 DF 3/5, EV 3/5    Time 10    Period Weeks    Status Partially Met                   Plan - 02/28/21 1705     Clinical Impression Statement Pt presents for f/u and final sixth visit of alloted visits for his worker's comp for right avulsion fracture of talus and peroneal tendon tear. Pt continues to be incredibly limited by pain especially with right ankle dorsiflexion. PT educated pt on rehab potential likely being limited on lateral side of ankle based on podiatry note and requiring surgery. Pt also educated on need for pain clinic treatment to address medial part of ankle. Further apts could benefit pt, but it is unclear about to what degree given pain limiting effect. Pt will seek additional PT visits through adjustor with effort to  continue to regain ankle mobility  despite ongoing pain.    Examination-Activity Limitations Locomotion Level;Lift;Carry;Stairs;Stand;Squat    Stability/Clinical Decision Making Stable/Uncomplicated    Rehab Potential Excellent    PT Frequency 2x / week    PT Duration 6 weeks    PT Treatment/Interventions Joint Manipulations;Moist Heat;Traction;Electrical Stimulation;Balance training;Neuromuscular re-education;Therapeutic exercise;Therapeutic activities;Gait training;Passive range of motion;Dry needling;Manual techniques;Cryotherapy;DME Instruction    PT Next Visit Plan Graded motion exercises for ankle and LE for strengthening and to increase ROM    PT Home Exercise Plan Lateral and posterior and anterior weight shifts (use plate to elevate heal on RLE), Ankle ABCs, and heel raises    Consulted and Agree with Plan of Care Patient             Patient will benefit from skilled therapeutic intervention in order to improve the following deficits and impairments:  Abnormal gait, Decreased range of motion, Difficulty walking, Pain, Hypomobility, Increased edema, Decreased strength  Visit Diagnosis: Pain in right ankle and joints of right foot  Difficulty in walking, not elsewhere classified     Problem List There are no problems to display for this patient.  Bradly Chris PT, DPT  02/28/2021, 5:19 PM  [facility] [address], [place], [address] Phone: [phone]   Fax:  [phone]  Name: Gary Savage MRN: [id number] Date of Birth: October 14, 1975

## 2021-03-05 ENCOUNTER — Ambulatory Visit: Payer: BC Managed Care – PPO | Admitting: Physical Therapy

## 2021-03-07 ENCOUNTER — Ambulatory Visit: Payer: PRIVATE HEALTH INSURANCE | Admitting: Physical Therapy

## 2021-03-07 ENCOUNTER — Telehealth: Payer: Self-pay | Admitting: Physical Therapy

## 2021-03-07 NOTE — Telephone Encounter (Signed)
Called pt to inquiry about absence and whether he was eligible for more PT. Left VM instructing pt to call office back to check in.

## 2021-03-12 ENCOUNTER — Telehealth: Payer: Self-pay | Admitting: Physical Therapy

## 2021-03-12 ENCOUNTER — Ambulatory Visit: Payer: BC Managed Care – PPO | Admitting: Physical Therapy

## 2021-03-12 NOTE — Telephone Encounter (Signed)
Called to inquiry about whether pt received information about whether he would receive more PT. Pt did not respond so VM left instructing pt to call back.

## 2021-03-18 ENCOUNTER — Encounter: Payer: BC Managed Care – PPO | Admitting: Physical Therapy

## 2021-03-20 ENCOUNTER — Encounter: Payer: BC Managed Care – PPO | Admitting: Physical Therapy

## 2021-05-10 DIAGNOSIS — S92101A Unspecified fracture of right talus, initial encounter for closed fracture: Secondary | ICD-10-CM | POA: Insufficient documentation

## 2021-05-10 DIAGNOSIS — S86319A Strain of muscle(s) and tendon(s) of peroneal muscle group at lower leg level, unspecified leg, initial encounter: Secondary | ICD-10-CM | POA: Insufficient documentation

## 2021-06-18 HISTORY — PX: ANKLE FRACTURE SURGERY: SHX122

## 2021-08-02 DIAGNOSIS — G90529 Complex regional pain syndrome I of unspecified lower limb: Secondary | ICD-10-CM | POA: Insufficient documentation

## 2021-08-08 DIAGNOSIS — M25671 Stiffness of right ankle, not elsewhere classified: Secondary | ICD-10-CM | POA: Insufficient documentation

## 2021-08-08 DIAGNOSIS — M25571 Pain in right ankle and joints of right foot: Secondary | ICD-10-CM | POA: Insufficient documentation

## 2021-09-02 DIAGNOSIS — Z4789 Encounter for other orthopedic aftercare: Secondary | ICD-10-CM | POA: Insufficient documentation

## 2021-09-29 NOTE — Progress Notes (Unsigned)
Patient: Gary Savage  Service Category: E/M  Provider: Gaspar Cola, MD  DOB: 1975-08-24  DOS: 10/02/2021  Referring Provider: Samara Deist, DPM  MRN: [id number]  Setting: Ambulatory outpatient  PCP: [place] Pink, MD  Type: New Patient  Specialty: Interventional Pain Management    Location: Office  Delivery: Face-to-face     Primary Reason(s) for Visit: Encounter for initial evaluation of one or more chronic problems (new to examiner) potentially causing chronic pain, and posing a threat to normal musculoskeletal function. (Level of risk: High) CC: No chief complaint on file.  HPI  Gary Savage is a 46 y.o. year old, male patient, who comes for the first time to our practice referred by Samara Deist, DPM for our initial evaluation of his chronic pain. He has Encounter for orthopedic follow-up care; Arthralgia of right ankle; Stiffness of right ankle joint; Closed fracture of right talus; Complex regional pain syndrome of lower limb; and Strain of peroneal tendon on their problem list. Today he comes in for evaluation of his No chief complaint on file.  Pain Assessment: Location:     Radiating:   Onset:   Duration:   Quality:   Severity:  /10 (subjective, self-reported pain score)  Effect on ADL:   Timing:   Modifying factors:   BP:    HR:    Onset and Duration: {Hx; Onset and Duration:210120511} Cause of pain: {Hx; Cause:210120521} Severity: {Pain Severity:210120502} Timing: {Symptoms; Timing:210120501} Aggravating Factors: {Causes; Aggravating pain factors:210120507} Alleviating Factors: {Causes; Alleviating Factors:210120500} Associated Problems: {Hx; Associated problems:210120515} Quality of Pain: {Hx; Symptom quality or Descriptor:210120531} Previous Examinations or Tests: {Hx; Previous examinations or test:210120529} Previous Treatments: {Hx; Previous Treatment:210120503}  ***  Today I took the time to provide the patient with information regarding my pain  practice. The patient was informed that my practice is divided into two sections: an interventional pain management section, as well as a completely separate and distinct medication management section. I explained that I have procedure days for my interventional therapies, and evaluation days for follow-ups and medication management. Because of the amount of documentation required during both, they are kept separated. This means that there is the possibility that he may be scheduled for a procedure on one day, and medication management the next. I have also informed him that because of staffing and facility limitations, I no longer take patients for medication management only. To illustrate the reasons for this, I gave the patient the example of surgeons, and how inappropriate it would be to refer a patient to his/her care, just to write for the post-surgical antibiotics on a surgery done by a different surgeon.   Because interventional pain management is my board-certified specialty, the patient was informed that joining my practice means that they are open to any and all interventional therapies. I made it clear that this does not mean that they will be forced to have any procedures done. What this means is that I believe interventional therapies to be essential part of the diagnosis and proper management of chronic pain conditions. Therefore, patients not interested in these interventional alternatives will be better served under the care of a different practitioner.  The patient was also made aware of my Comprehensive Pain Management Safety Guidelines where by joining my practice, they limit all of their nerve blocks and joint injections to those done by our practice, for as long as we are retained to manage their care.   Historic Controlled Substance Pharmacotherapy Review  PMP and historical list  of controlled substances: ***  Current opioid analgesics:   *** MME/day: *** mg/day  Historical  Monitoring: The patient  reports no history of drug use. List of all UDS Test(s): No results found for: "MDMA", "COCAINSCRNUR", "PCPSCRNUR", "PCPQUANT", "CANNABQUANT", "THCU", "ETH" List of other Serum/Urine Drug Screening Test(s):  No results found for: "AMPHSCRSER", "BARBSCRSER", "BENZOSCRSER", "COCAINSCRSER", "COCAINSCRNUR", "PCPSCRSER", "PCPQUANT", "THCSCRSER", "THCU", "CANNABQUANT", "OPIATESCRSER", "OXYSCRSER", "PROPOXSCRSER", "ETH" Historical Background Evaluation: [place] PMP: PDMP reviewed during this encounter. Online review of the past 43-monthperiod conducted.             PMP NARX Score Report:  Narcotic: *** Sedative: *** Stimulant: ***  Department of public safety, offender search: (Editor, commissioningInformation) Non-contributory Risk Assessment Profile: Aberrant behavior: None observed or detected today Risk factors for fatal opioid overdose: None identified today PMP NARX Overdose Risk Score: *** Fatal overdose hazard ratio (HR): Calculation deferred Non-fatal overdose hazard ratio (HR): Calculation deferred Risk of opioid abuse or dependence: 0.7-3.0% with doses ? 36 MME/day and 6.1-26% with doses ? 120 MME/day. Substance use disorder (SUD) risk level: See below Personal History of Substance Abuse (SUD-Substance use disorder):  Alcohol:    Illegal Drugs:    Rx Drugs:    ORT Risk Level calculation:    ORT Scoring interpretation table:  Score <3 = Low Risk for SUD  Score between 4-7 = Moderate Risk for SUD  Score >8 = High Risk for Opioid Abuse   PHQ-2 Depression Scale:  Total score:    PHQ-2 Scoring interpretation table: (Score and probability of major depressive disorder)  Score 0 = No depression  Score 1 = 15.4% Probability  Score 2 = 21.1% Probability  Score 3 = 38.4% Probability  Score 4 = 45.5% Probability  Score 5 = 56.4% Probability  Score 6 = 78.6% Probability   PHQ-9 Depression Scale:  Total score:    PHQ-9 Scoring interpretation table:  Score 0-4 = No  depression  Score 5-9 = Mild depression  Score 10-14 = Moderate depression  Score 15-19 = Moderately severe depression  Score 20-27 = Severe depression (2.4 times higher risk of SUD and 2.89 times higher risk of overuse)   Pharmacologic Plan: As per protocol, I have not taken over any controlled substance management, pending the results of ordered tests and/or consults.            Initial impression: Pending review of available data and ordered tests.  Meds   Current Outpatient Medications:    etodolac (LODINE) 400 MG tablet, Take 1 tablet by mouth 2 (two) times daily., Disp: , Rfl:    gabapentin (NEURONTIN) 300 MG capsule, Take by mouth., Disp: , Rfl:    oxyCODONE-acetaminophen (PERCOCET/ROXICET) 5-325 MG tablet, Take by mouth., Disp: , Rfl:    aspirin EC 81 MG tablet, aspirin 81 mg tablet,delayed release  Take 1 tablet twice a day by oral route., Disp: , Rfl:    COVID-19 At Home Antigen Test (FLOWFLEX COVID-19 AG HOME TEST) KIT, flowflex     kit test, Disp: , Rfl:    gabapentin (NEURONTIN) 300 MG capsule, gabapentin 300 mg capsule  TAKE 1 CAPSULE (300 MG TOTAL) BY MOUTH EVERY DAY AT BEDTIME, Disp: , Rfl:    gabapentin (NEURONTIN) 400 MG capsule, gabapentin 400 mg capsule, Disp: , Rfl:    HYDROcodone-acetaminophen (NORCO/VICODIN) 5-325 MG tablet, hydrocodone 5 mg-acetaminophen 325 mg tablet  Take 1 tablet every 4 hours by oral route as needed., Disp: , Rfl:    ibuprofen (ADVIL,MOTRIN) 600 MG tablet,  Take 1 tablet (600 mg total) by mouth every 8 (eight) hours as needed., Disp: 15 tablet, Rfl: 0   meloxicam (MOBIC) 7.5 MG tablet, meloxicam 7.5 mg tablet, Disp: , Rfl:    oxyCODONE (ROXICODONE) 5 MG immediate release tablet, Take 1 tablet (5 mg total) by mouth every 4 (four) hours as needed., Disp: 30 tablet, Rfl: 0   oxyCODONE-acetaminophen (PERCOCET) 5-325 MG tablet, Take 1 tablet by mouth every 4 (four) hours as needed for severe pain., Disp: 20 tablet, Rfl: 0   traMADol (ULTRAM) 50 MG  tablet, tramadol 50 mg tablet  Take 1 tablet every 8 hours by oral route as needed., Disp: , Rfl:   Imaging Review  Cervical Imaging: Cervical MR wo contrast: No results found for this or any previous visit.  Cervical MR wo contrast: No valid procedures specified. Cervical MR w/wo contrast: No results found for this or any previous visit.  Cervical MR w contrast: No results found for this or any previous visit.  Cervical CT wo contrast: No results found for this or any previous visit.  Cervical CT w/wo contrast: No results found for this or any previous visit.  Cervical CT w/wo contrast: No results found for this or any previous visit.  Cervical CT w contrast: No results found for this or any previous visit.  Cervical CT outside: No results found for this or any previous visit.  Cervical DG 1 view: No results found for this or any previous visit.  Cervical DG 2-3 views: No results found for this or any previous visit.  Cervical DG F/E views: No results found for this or any previous visit.  Cervical DG 2-3 clearing views: No results found for this or any previous visit.  Cervical DG Bending/F/E views: No results found for this or any previous visit.  Cervical DG complete: No results found for this or any previous visit.  Cervical DG Myelogram views: No results found for this or any previous visit.  Cervical DG Myelogram views: No results found for this or any previous visit.  Cervical Discogram views: No results found for this or any previous visit.   Shoulder Imaging: Shoulder-R MR w contrast: No results found for this or any previous visit.  Shoulder-L MR w contrast: No results found for this or any previous visit.  Shoulder-R MR w/wo contrast: No results found for this or any previous visit.  Shoulder-L MR w/wo contrast: No results found for this or any previous visit.  Shoulder-R MR wo contrast: No results found for this or any previous visit.  Shoulder-L MR wo  contrast: No results found for this or any previous visit.  Shoulder-R CT w contrast: No results found for this or any previous visit.  Shoulder-L CT w contrast: No results found for this or any previous visit.  Shoulder-R CT w/wo contrast: No results found for this or any previous visit.  Shoulder-L CT w/wo contrast: No results found for this or any previous visit.  Shoulder-R CT wo contrast: No results found for this or any previous visit.  Shoulder-L CT wo contrast: No results found for this or any previous visit.  Shoulder-R DG Arthrogram: No results found for this or any previous visit.  Shoulder-L DG Arthrogram: No results found for this or any previous visit.  Shoulder-R DG 1 view: No results found for this or any previous visit.  Shoulder-L DG 1 view: No results found for this or any previous visit.  Shoulder-R DG: No results found for this or  any previous visit.  Shoulder-L DG: No results found for this or any previous visit.   Thoracic Imaging: Thoracic MR wo contrast: No results found for this or any previous visit.  Thoracic MR wo contrast: No valid procedures specified. Thoracic MR w/wo contrast: No results found for this or any previous visit.  Thoracic MR w contrast: No results found for this or any previous visit.  Thoracic CT wo contrast: No results found for this or any previous visit.  Thoracic CT w/wo contrast: No results found for this or any previous visit.  Thoracic CT w/wo contrast: No results found for this or any previous visit.  Thoracic CT w contrast: No results found for this or any previous visit.  Thoracic DG 2-3 views: No results found for this or any previous visit.  Thoracic DG 4 views: No results found for this or any previous visit.  Thoracic DG: No results found for this or any previous visit.  Thoracic DG w/swimmers view: No results found for this or any previous visit.  Thoracic DG Myelogram views: No results found for this or  any previous visit.  Thoracic DG Myelogram views: No results found for this or any previous visit.   Lumbosacral Imaging: Lumbar MR wo contrast: No results found for this or any previous visit.  Lumbar MR wo contrast: No valid procedures specified. Lumbar MR w/wo contrast: No results found for this or any previous visit.  Lumbar MR w/wo contrast: No results found for this or any previous visit.  Lumbar MR w contrast: No results found for this or any previous visit.  Lumbar CT wo contrast: No results found for this or any previous visit.  Lumbar CT w/wo contrast: No results found for this or any previous visit.  Lumbar CT w/wo contrast: No results found for this or any previous visit.  Lumbar CT w contrast: No results found for this or any previous visit.  Lumbar DG 1V: No results found for this or any previous visit.  Lumbar DG 1V (Clearing): No results found for this or any previous visit.  Lumbar DG 2-3V (Clearing): No results found for this or any previous visit.  Lumbar DG 2-3 views: No results found for this or any previous visit.  Lumbar DG (Complete) 4+V: No results found for this or any previous visit.        Lumbar DG F/E views: No results found for this or any previous visit.        Lumbar DG Bending views: No results found for this or any previous visit.        Lumbar DG Myelogram views: No results found for this or any previous visit.  Lumbar DG Myelogram: No results found for this or any previous visit.  Lumbar DG Myelogram: No results found for this or any previous visit.  Lumbar DG Myelogram: No results found for this or any previous visit.  Lumbar DG Myelogram Lumbosacral: No results found for this or any previous visit.  Lumbar DG Diskogram views: No results found for this or any previous visit.  Lumbar DG Diskogram views: No results found for this or any previous visit.  Lumbar DG Epidurogram OP: No results found for this or any previous  visit.  Lumbar DG Epidurogram IP: No valid procedures specified.  Sacroiliac Joint Imaging: Sacroiliac Joint DG: No results found for this or any previous visit.  Sacroiliac Joint MR w/wo contrast: No results found for this or any previous visit.  Sacroiliac Joint MR wo  contrast: No results found for this or any previous visit.   Spine Imaging: Whole Spine DG Myelogram views: No results found for this or any previous visit.  Whole Spine MR Mets screen: No results found for this or any previous visit.  Whole Spine MR Mets screen: No results found for this or any previous visit.  Whole Spine MR w/wo: No results found for this or any previous visit.  MRA Spinal Canal w/ cm: No results found for this or any previous visit.  MRA Spinal Canal wo/ cm: No valid procedures specified. MRA Spinal Canal w/wo cm: No results found for this or any previous visit.  Spine Outside MR Films: No results found for this or any previous visit.  Spine Outside CT Films: No results found for this or any previous visit.  CT-Guided Biopsy: No results found for this or any previous visit.  CT-Guided Needle Placement: No results found for this or any previous visit.  DG Spine outside: No results found for this or any previous visit.  IR Spine outside: No results found for this or any previous visit.  NM Spine outside: No results found for this or any previous visit.   Hip Imaging: Hip-R MR w contrast: No results found for this or any previous visit.  Hip-L MR w contrast: No results found for this or any previous visit.  Hip-R MR w/wo contrast: No results found for this or any previous visit.  Hip-L MR w/wo contrast: No results found for this or any previous visit.  Hip-R MR wo contrast: No results found for this or any previous visit.  Hip-L MR wo contrast: No results found for this or any previous visit.  Hip-R CT w contrast: No results found for this or any previous visit.  Hip-L CT w  contrast: No results found for this or any previous visit.  Hip-R CT w/wo contrast: No results found for this or any previous visit.  Hip-L CT w/wo contrast: No results found for this or any previous visit.  Hip-R CT wo contrast: No results found for this or any previous visit.  Hip-L CT wo contrast: No results found for this or any previous visit.  Hip-R DG 2-3 views: No results found for this or any previous visit.  Hip-L DG 2-3 views: No results found for this or any previous visit.  Hip-R DG Arthrogram: No results found for this or any previous visit.  Hip-L DG Arthrogram: No results found for this or any previous visit.  Hip-B DG Bilateral: No results found for this or any previous visit.   Knee Imaging: Knee-R MR w contrast: No results found for this or any previous visit.  Knee-L MR w/o contrast: No results found for this or any previous visit.  Knee-R MR w/wo contrast: No results found for this or any previous visit.  Knee-L MR w/wo contrast: No results found for this or any previous visit.  Knee-R MR wo contrast: No results found for this or any previous visit.  Knee-L MR wo contrast: No results found for this or any previous visit.  Knee-R CT w contrast: No results found for this or any previous visit.  Knee-L CT w contrast: No results found for this or any previous visit.  Knee-R CT w/wo contrast: No results found for this or any previous visit.  Knee-L CT w/wo contrast: No results found for this or any previous visit.  Knee-R CT wo contrast: No results found for this or any previous visit.  Knee-L CT wo contrast: No results found for this or any previous visit.  Knee-R DG 1-2 views: No results found for this or any previous visit.  Knee-L DG 1-2 views: No results found for this or any previous visit.  Knee-R DG 3 views: No results found for this or any previous visit.  Knee-L DG 3 views: No results found for this or any previous visit.  Knee-R DG 4  views: No results found for this or any previous visit.  Knee-L DG 4 views: No results found for this or any previous visit.  Knee-R DG Arthrogram: No results found for this or any previous visit.  Knee-L DG Arthrogram: No results found for this or any previous visit.   Ankle Imaging: Ankle-R DG Complete: Results for orders placed during the hospital encounter of 11/12/20  DG Ankle Complete Right  Narrative CLINICAL DATA:  Right ankle injury, medial ankle pain  EXAM: RIGHT ANKLE - COMPLETE 3+ VIEW  COMPARISON:  None.  FINDINGS: There is an avulsion fracture fragment at the medial talus and likely a tiny avulsion fragment of the medial malleolus. Suspect pilon type fracture of the lateral subtalar facet versus partial bony coalition, not well evaluated by plain film. Extensive soft tissue edema about the medial ankle.  IMPRESSION: 1. There is an avulsion fracture fragment at the medial talus and likely a tiny avulsion fragment of the medial malleolus. 2. Suspect pilon type fracture of the lateral subtalar facet versus partial bony coalition, not well evaluated by plain film. 3. Consider CT to better evaluate suspected fracture anatomy. 4. Extensive soft tissue edema about the medial ankle.   Electronically Signed By: Eddie Candle M.D. On: 11/12/2020 14:48  Ankle-L DG Complete: Results for orders placed during the hospital encounter of 07/20/16  DG Ankle Complete Left  Narrative CLINICAL DATA:  Initial evaluation for acute trauma, twisted ankle.  EXAM: LEFT ANKLE COMPLETE - 3+ VIEW  COMPARISON:  None.  FINDINGS: No acute fracture or dislocation. Ankle mortise approximated. Talar dome intact. No joint effusion. Osseous mineralization normal. No significant soft tissue swelling.  IMPRESSION: No acute osseous abnormality about the left ankle.   Electronically Signed By: Jeannine Boga M.D. On: 07/20/2016 20:12   Foot Imaging: Foot-R DG Complete:  No results found for this or any previous visit.  Foot-L DG Complete: No results found for this or any previous visit.   Elbow Imaging: Elbow-R DG Complete: No results found for this or any previous visit.  Elbow-L DG Complete: No results found for this or any previous visit.   Wrist Imaging: Wrist-R DG Complete: No results found for this or any previous visit.  Wrist-L DG Complete: No results found for this or any previous visit.   Hand Imaging: Hand-R DG Complete: No results found for this or any previous visit.  Hand-L DG Complete: No results found for this or any previous visit.   Complexity Note: Imaging results reviewed. Results shared with Gary Savage, using Layman's terms.                         ROS  Cardiovascular: {Hx; Cardiovascular History:210120525} Pulmonary or Respiratory: {Hx; Pumonary and/or Respiratory History:210120523} Neurological: {Hx; Neurological:210120504} Psychological-Psychiatric: {Hx; Psychological-Psychiatric History:210120512} Gastrointestinal: {Hx; Gastrointestinal:210120527} Genitourinary: {Hx; Genitourinary:210120506} Hematological: {Hx; Hematological:210120510} Endocrine: {Hx; Endocrine history:210120509} Rheumatologic: {Hx; Rheumatological:210120530} Musculoskeletal: {Hx; Musculoskeletal:210120528} Work History: {Hx; Work history:210120514}  Allergies  Gary Savage is allergic to hydrocodone and penicillins.  Laboratory Chemistry Profile   Renal Lab  Results  Component Value Date   BUN 8 10/14/2017   CREATININE 1.24 10/14/2017   GFRAA >60 10/14/2017   GFRNONAA >60 10/14/2017   PROTEINUR 100 (A) 03/08/2020     Electrolytes Lab Results  Component Value Date   NA 136 10/14/2017   K 3.9 10/14/2017   CL 104 10/14/2017   CALCIUM 8.7 (L) 10/14/2017     Hepatic No results found for: "AST", "ALT", "ALBUMIN", "ALKPHOS", "AMYLASE", "LIPASE", "AMMONIA"   ID No results found for: "LYMEIGGIGMAB", "HIV", "SARSCOV2NAA",  "STAPHAUREUS", "MRSAPCR", "HCVAB", "PREGTESTUR", "RMSFIGG", "QFVRPH1IGG", "QFVRPH2IGG"   Bone No results found for: "VD25OH", "VD125OH2TOT", "WT8882CM0", "LK9179XT0", "25OHVITD1", "25OHVITD2", "25OHVITD3", "TESTOFREE", "TESTOSTERONE"   Endocrine Lab Results  Component Value Date   GLUCOSE 126 (H) 10/14/2017   GLUCOSEU NEGATIVE 03/08/2020     Neuropathy No results found for: "VITAMINB12", "FOLATE", "HGBA1C", "HIV"   CNS No results found for: "COLORCSF", "APPEARCSF", "RBCCOUNTCSF", "WBCCSF", "POLYSCSF", "LYMPHSCSF", "EOSCSF", "PROTEINCSF", "GLUCCSF", "JCVIRUS", "CSFOLI", "IGGCSF", "LABACHR", "ACETBL"   Inflammation (CRP: Acute  ESR: Chronic) No results found for: "CRP", "ESRSEDRATE", "LATICACIDVEN"   Rheumatology No results found for: "RF", "ANA", "LABURIC", "URICUR", "LYMEIGGIGMAB", "LYMEABIGMQN", "HLAB27"   Coagulation Lab Results  Component Value Date   PLT 304 10/14/2017     Cardiovascular Lab Results  Component Value Date   TROPONINI < 0.02 11/23/2013   HGB 13.6 10/14/2017   HCT 40.1 10/14/2017     Screening No results found for: "SARSCOV2NAA", "COVIDSOURCE", "STAPHAUREUS", "MRSAPCR", "HCVAB", "HIV", "PREGTESTUR"   Cancer No results found for: "CEA", "CA125", "LABCA2"   Allergens No results found for: "ALMOND", "APPLE", "ASPARAGUS", "AVOCADO", "BANANA", "BARLEY", "BASIL", "BAYLEAF", "GREENBEAN", "LIMABEAN", "WHITEBEAN", "BEEFIGE", "REDBEET", "BLUEBERRY", "BROCCOLI", "CABBAGE", "MELON", "CARROT", "CASEIN", "CASHEWNUT", "CAULIFLOWER", "CELERY"     Note: Lab results reviewed.  [place]  Drug: Gary Savage  reports no history of drug use. Alcohol:  reports current alcohol use. Tobacco:  reports that he has been smoking cigarettes. He has been smoking an average of .5 packs per day. He has never used smokeless tobacco. Medical:  has no past medical history on file. Family: family history includes Healthy in his mother.  Past Surgical History:  Procedure Laterality Date    KNEE ARTHROSCOPY Left    Active Ambulatory Problems    Diagnosis Date Noted   Encounter for orthopedic follow-up care 09/02/2021   Arthralgia of right ankle 08/08/2021   Stiffness of right ankle joint 08/08/2021   Closed fracture of right talus 05/10/2021   Complex regional pain syndrome of lower limb 08/02/2021   Strain of peroneal tendon 05/10/2021   Resolved Ambulatory Problems    Diagnosis Date Noted   No Resolved Ambulatory Problems   No Additional Past Medical History   Constitutional Exam  General appearance: Well nourished, well developed, and well hydrated. In no apparent acute distress There were no vitals filed for this visit. BMI Assessment: Estimated body mass index is 23.06 kg/m as calculated from the following:   Height as of 11/12/20: 6' (1.829 m).   Weight as of 11/12/20: 170 lb (77.1 kg).  BMI interpretation table: BMI level Category Range association with higher incidence of chronic pain  <18 kg/m2 Underweight   18.5-24.9 kg/m2 Ideal body weight   25-29.9 kg/m2 Overweight Increased incidence by 20%  30-34.9 kg/m2 Obese (Class I) Increased incidence by 68%  35-39.9 kg/m2 Severe obesity (Class II) Increased incidence by 136%  >40 kg/m2 Extreme obesity (Class III) Increased incidence by 254%   Patient's current BMI Ideal Body weight  There is no height  or weight on file to calculate BMI. Patient weight not recorded   BMI Readings from Last 4 Encounters:  11/12/20 23.06 kg/m  03/08/20 24.41 kg/m  11/25/19 23.73 kg/m  10/14/17 23.06 kg/m   Wt Readings from Last 4 Encounters:  11/12/20 170 lb (77.1 kg)  03/08/20 180 lb (81.6 kg)  11/25/19 175 lb (79.4 kg)  10/14/17 170 lb (77.1 kg)    Psych/Mental status: Alert, oriented x 3 (person, place, & time)       Eyes: PERLA Respiratory: No evidence of acute respiratory distress  Assessment  Primary Diagnosis & Pertinent Problem List: There were no encounter diagnoses.  Visit Diagnosis (New problems  to examiner): No diagnosis found. Plan of Care (Initial workup plan)  Note: Gary Savage was reminded that as per protocol, today's visit has been an evaluation only. We have not taken over the patient's controlled substance management.  Problem-specific plan: No problem-specific Assessment & Plan notes found for this encounter.  Lab Orders  No laboratory test(s) ordered today   Imaging Orders  No imaging studies ordered today   Referral Orders  No referral(s) requested today   Procedure Orders    No procedure(s) ordered today   Pharmacotherapy (current): Medications ordered:  No orders of the defined types were placed in this encounter.  Medications administered during this visit: Gary Savage had no medications administered during this visit.   Pharmacological management options:  Opioid Analgesics: The patient was informed that there is no guarantee that he would be a candidate for opioid analgesics. The decision will be made following CDC guidelines. This decision will be based on the results of diagnostic studies, as well as Gary Savage's risk profile.   Membrane stabilizer: To be determined at a later time  Muscle relaxant: To be determined at a later time  NSAID: To be determined at a later time  Other analgesic(s): To be determined at a later time   Interventional management options: Gary Savage was informed that there is no guarantee that he would be a candidate for interventional therapies. The decision will be based on the results of diagnostic studies, as well as Gary Savage's risk profile.  Procedure(s) under consideration:  Pending results of ordered studies      Interventional Therapies  Risk  Complexity Considerations:   Estimated body mass index is 23.06 kg/m as calculated from the following:   Height as of 11/12/20: 6' (1.829 m).   Weight as of 11/12/20: 170 lb (77.1 kg). WNL   Planned  Pending:   Pending further evaluation   Under  consideration:   ***   Completed:   None at this time   Therapeutic  Palliative (PRN) options:   None established      Provider-requested follow-up: No follow-ups on file.  Future Appointments  Date Time Provider [place]  10/02/2021 11:00 AM Milinda Pointer, MD [facility] None    Note by: Gaspar Cola, MD Date: 10/02/2021; Time: 8:28 AM

## 2021-10-02 ENCOUNTER — Ambulatory Visit: Payer: PRIVATE HEALTH INSURANCE | Attending: Pain Medicine | Admitting: Pain Medicine

## 2021-10-02 ENCOUNTER — Encounter: Payer: Self-pay | Admitting: Pain Medicine

## 2021-10-02 ENCOUNTER — Other Ambulatory Visit: Payer: Self-pay

## 2021-10-02 VITALS — BP 130/102 | HR 105 | Temp 98.0°F | Resp 16 | Ht 72.0 in | Wt 170.0 lb

## 2021-10-02 DIAGNOSIS — G90521 Complex regional pain syndrome I of right lower limb: Secondary | ICD-10-CM | POA: Insufficient documentation

## 2021-10-02 DIAGNOSIS — G8929 Other chronic pain: Secondary | ICD-10-CM | POA: Insufficient documentation

## 2021-10-02 DIAGNOSIS — S92121S Displaced fracture of body of right talus, sequela: Secondary | ICD-10-CM | POA: Diagnosis present

## 2021-10-02 DIAGNOSIS — S8401XS Injury of tibial nerve at lower leg level, right leg, sequela: Secondary | ICD-10-CM | POA: Diagnosis present

## 2021-10-02 DIAGNOSIS — M25471 Effusion, right ankle: Secondary | ICD-10-CM | POA: Insufficient documentation

## 2021-10-02 DIAGNOSIS — M899 Disorder of bone, unspecified: Secondary | ICD-10-CM | POA: Diagnosis present

## 2021-10-02 DIAGNOSIS — Z789 Other specified health status: Secondary | ICD-10-CM | POA: Insufficient documentation

## 2021-10-02 DIAGNOSIS — G894 Chronic pain syndrome: Secondary | ICD-10-CM | POA: Diagnosis present

## 2021-10-02 DIAGNOSIS — R9389 Abnormal findings on diagnostic imaging of other specified body structures: Secondary | ICD-10-CM | POA: Insufficient documentation

## 2021-10-02 DIAGNOSIS — Z79899 Other long term (current) drug therapy: Secondary | ICD-10-CM | POA: Diagnosis present

## 2021-10-02 DIAGNOSIS — M25571 Pain in right ankle and joints of right foot: Secondary | ICD-10-CM | POA: Diagnosis present

## 2021-10-02 DIAGNOSIS — S9401XS Injury of lateral plantar nerve, right leg, sequela: Secondary | ICD-10-CM | POA: Insufficient documentation

## 2021-10-02 DIAGNOSIS — S81801S Unspecified open wound, right lower leg, sequela: Secondary | ICD-10-CM | POA: Insufficient documentation

## 2021-10-02 NOTE — Progress Notes (Signed)
Safety precautions to be maintained throughout the outpatient stay will include: orient to surroundings, keep bed in low position, maintain call bell within reach at all times, provide assistance with transfer out of bed and ambulation.  

## 2021-10-08 LAB — COMPLIANCE DRUG ANALYSIS, UR

## 2021-10-24 ENCOUNTER — Other Ambulatory Visit (HOSPITAL_COMMUNITY): Payer: Self-pay

## 2021-10-24 ENCOUNTER — Encounter (HOSPITAL_COMMUNITY): Payer: Self-pay

## 2021-11-04 ENCOUNTER — Other Ambulatory Visit: Payer: Self-pay | Admitting: Pain Medicine

## 2021-11-04 DIAGNOSIS — M19079 Primary osteoarthritis, unspecified ankle and foot: Secondary | ICD-10-CM | POA: Insufficient documentation

## 2021-11-04 DIAGNOSIS — H61129 Hematoma of pinna, unspecified ear: Secondary | ICD-10-CM | POA: Insufficient documentation

## 2021-11-21 ENCOUNTER — Ambulatory Visit: Payer: Self-pay

## 2021-11-27 ENCOUNTER — Ambulatory Visit: Payer: Self-pay | Admitting: Pain Medicine

## 2021-12-26 ENCOUNTER — Encounter: Payer: Self-pay | Admitting: Pain Medicine

## 2022-01-02 ENCOUNTER — Encounter
Admission: RE | Admit: 2022-01-02 | Discharge: 2022-01-02 | Disposition: A | Discharge status: Home / Self Care | Payer: PRIVATE HEALTH INSURANCE | Source: Ambulatory Visit | Attending: Pain Medicine | Admitting: Pain Medicine

## 2022-01-02 DIAGNOSIS — R9389 Abnormal findings on diagnostic imaging of other specified body structures: Secondary | ICD-10-CM | POA: Insufficient documentation

## 2022-01-02 DIAGNOSIS — S92121S Displaced fracture of body of right talus, sequela: Secondary | ICD-10-CM | POA: Insufficient documentation

## 2022-01-02 DIAGNOSIS — G894 Chronic pain syndrome: Secondary | ICD-10-CM | POA: Diagnosis not present

## 2022-01-02 DIAGNOSIS — G90521 Complex regional pain syndrome I of right lower limb: Secondary | ICD-10-CM | POA: Diagnosis present

## 2022-01-02 MED ORDER — TECHNETIUM TC 99M MEDRONATE IV KIT
20.0000 | PACK | Freq: Once | INTRAVENOUS | Status: AC | PRN
  Administered 2022-01-02: 19.16 via INTRAVENOUS

## 2022-01-20 ENCOUNTER — Ambulatory Visit: Payer: Self-pay | Admitting: Pain Medicine

## 2022-01-26 NOTE — Progress Notes (Signed)
PROVIDER NOTE: Information contained herein reflects review and annotations entered in association with encounter. Interpretation of such information and data should be left to medically-trained personnel. Information provided to patient can be located elsewhere in the medical record under "Patient Instructions". Document created using STT-dictation technology, any transcriptional errors that may result from process are unintentional.    Patient: Gary Savage  Service Category: E/M  Provider: Gaspar Cola, MD  DOB: [birth year]  DOS: 01/30/2022  Referring Provider: Maryland Pink, MD  MRN: [id number]  Specialty: Interventional Pain Management  PCP: Maryland Pink, MD  Type: Established Patient  Setting: Ambulatory outpatient    Location: Office  Delivery: Face-to-face     Primary Reason(s) for Visit: Encounter for evaluation before starting new chronic pain management plan of care (Level of risk: moderate) CC: Ankle Pain (Right )  HPI  [name] is a 46 y.o. year old, male patient, who comes today for a follow-up evaluation to review the test results and decide on a treatment plan. 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 His primarily concern today is the Ankle Pain (Right )  Pain  Assessment: Location: Right Ankle Radiating: goes up a small bit above the ankle and all the way around the ankle. Onset: More than a month ago Duration: Chronic pain Quality: Discomfort, Constant, Throbbing, Burning, Other (Comment), Sore, Stabbing (stinging and burning, feeling of bee stings) Severity: 6 /10 (subjective, self-reported pain score)  Effect on ADL: being up on the ankle causes increased pain.  sitting for a period of time, when he gets up the ankle is very stiff and painful. Timing: Constant Modifying factors: nothing currently BP: (!) 124/97  HR: 95  [name] comes in today for a follow-up visit after his initial evaluation on 10/02/2021. Today we went over the results of his tests. These were explained in "Layman's terms". During today's appointment we went over my diagnostic impression, as well as the proposed treatment plan.  Review of initial evaluation (10/02/2021): "This is a Worker's Compensation case with date of injury 11/12/2020.  The patient indicates having been involved in an accident where his right ankle was apparently crushed in a loading dock.  He suffered several fractures and eventually underwent surgery on 06/18/2021.  The surgery apparently was done by Dr. Wylene Simmer [facility]).  He states that he is still having the pain despite the surgery.  He is also complaining of swelling as well as call or changes.  The patient denies any nerve conduction test or any type of x-rays or imaging after the surgery.  He did have physical therapy for a total of 12 sections and he is apparently scheduled to have an additional 10.  He refers that while he is doing the physical therapy it hurts but after the physical therapy it increases the pain significantly.  The pain is described to be from the ankle down over the Achilles tendon  and the medial portion of the foot with swelling right behind the medial malleolus.  He has numbness over the top of the foot with bee sting and  sensations in the bottom of the foot.  He also refers that the pain in the bottom of the foot is a burning sensation that feels like a "blow torch".  Range of motion of the on affected ankle is within normal limits, but when it comes to the right ankle he has decreased range of motion on dorsiflexion with pain upon attempting that.  He refers having constant throbbing pain around the ankle with occasional sharp stabbing sensations.  He also refers that in the lateral aspect of his foot he has numbness in the area of the small toe in the one next to it.  He has well healed scars on both sides of the ankle.  Based on the distribution of the pain the patient seems to have an injury affecting the lateral plantar nerve as well as the posterior tibial nerve.  Pharmacotherapy: The patient indicates currently taking gabapentin 400 mg p.o. 3 times daily which is helping with the burning sensation.  He also refers taking over-the-counter Advil and Tylenol.  He also indicated having taken some tramadol in the past which apparently did help.  He states that if possible, he would like to have this fixed since he is a single parent of a 33 and a 23 year old child."  Review of the ordered tests reveals that the patient did have the UDS done, but none of the other tests were apparently done.  Bone scan of the right ankle was performed and it demonstrated increased uptake noted in the on united fracture involving the posterior and medial aspect of the talus.  There is also increased uptake in the lateral aspect of the posterior talo calcaneal facet likely posttraumatic arthritis.  The MRI of the right ankle was also done, apparently at [facility] in [place].  The conclusion of the report indicates moderate grade residual bone contusion or stress reaction in the posterior talus, ongoing and mildly decreased in severity compared to the prior exam.  There is also arthrosis at the posterior facet of the subtalar joint  mildly increased in severity.  There is also an old fracture of the lateral talar process with bony bridging.  Scattering/fibrosis of the ATFL (anterior tibiofibular ligament) and likely chronic.  Current/active PTFL (posterior tibiofibular ligament) sprain versus posterolateral impingement.  Current/active low-grade retro to infra malleoli are peroneus brevis tendinosis, unchanged.  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  Today the patient indicates that his pain is over the top of the foot, just lateral to this superior extensor retinaculum, but still above the inferior extensor retinaculum.  He also describes having pain in the calcaneal tendon region at about the level of the lateral malleolus.  He also describes having a constant burning sensation over the plantar aspect of the foot corresponding to the area of the medial and lateral plantar nerves.  He continues to have problems with swelling of the foot and ankle which is completely dependent on the amount of activity that he does while standing up.  He refers having quite a bit of problems with walking on soft uneven surfaces.  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  There is arthrosis of the posterior facet of the subtalar joint which has actually increased in severity.  There is ongoing bony bridging involving the lateral talar process.  There is also a possible active post anterolateral impingement of the posterior tibiofibular ligament.  In addition to the above there is also an active, persistent low-grade retroinframalleolar peroneus brevis tendinosis.  Based on the available  information at this time, and the fact that this injury occurred around July 2022, it is more likely than not that this damage is permanent and will continue to cause right foot and ankle pain to this patient, for the rest of his life.  Asked to the complex regional pain syndrome, at this point his current signs and symptoms do not fulfill the criteria for the diagnosis of CRPS.  Today I have offered the patient to do some local anesthetic and steroid injections into the affected area to see if there may be some benefit from those.  However, I have informed the patient not to get his hopes to high as this condition is likely to be permanent.  Controlled Substance Pharmacotherapy Assessment REMS (Risk Evaluation and Mitigation Strategy)  Opioid Analgesic: None MME/day: 0 mg/day  Pill Count: None expected due to no prior prescriptions written by our practice. Janett Billow, RN  01/30/2022  1:27 PM  Sign when Signing Visit Safety precautions to be maintained throughout the outpatient stay will include: orient to surroundings, keep bed in low position, maintain call bell within reach at all times, provide assistance with transfer out of bed and ambulation.    Pharmacokinetics: Liberation and absorption (onset of action): WNL Distribution (time to peak effect): WNL Metabolism and excretion (duration of action): WNL         Pharmacodynamics: Desired effects: Analgesia: Mr. Haffey reports >50% benefit. Functional ability: Patient reports that medication allows him to accomplish basic ADLs Clinically meaningful improvement in function (CMIF): Sustained CMIF goals met Perceived effectiveness: Described as relatively effective, allowing for increase in activities of daily living (ADL) Undesirable effects: Side-effects or Adverse reactions: None reported Monitoring: [place] PMP: PDMP reviewed during this encounter. Online review of the past 75-month period previously conducted. Not applicable at  this point since we have not taken over the patient's medication management yet. List of other Serum/Urine Drug Screening Test(s):  No results found for: "AMPHSCRSER", "BARBSCRSER", "BENZOSCRSER", "COCAINSCRSER", "COCAINSCRNUR", "PCPSCRSER", "THCSCRSER", "THCU", "CANNABQUANT", "OPIATESCRSER", "OXYSCRSER", "PROPOXSCRSER", "ETH", "CBDTHCR", "D8THCCBX", "D9THCCBX" List of all UDS test(s) done:  Lab Results  Component Value Date   SUMMARY Note 10/02/2021   Last UDS on record: Summary  Date Value Ref Range Status  10/02/2021 Note  Final    Comment:    ==================================================================== Compliance Drug Analysis, Ur ==================================================================== Test                             Result       Flag       Units  Drug Present and Declared for Prescription Verification   Gabapentin                     PRESENT      EXPECTED  Drug Present not Declared for Prescription Verification   Ephedrine/Pseudoephedrine      PRESENT      UNEXPECTED   Phenylpropanolamine  PRESENT      UNEXPECTED    Source of ephedrine/pseudoephedrine is most commonly pseudoephedrine    in over-the-counter or prescription cold and allergy medications.    Phenylpropanolamine is an expected metabolite of    ephedrine/pseudoephedrine.  ==================================================================== Test                      Result    Flag   Units      Ref Range   Creatinine              30               mg/dL      >=20 ==================================================================== Declared Medications:  The flagging and interpretation on this report are based on the  following declared medications.  Unexpected results may arise from  inaccuracies in the declared medications.   **Note: The testing scope of this panel includes these medications:   Gabapentin  (Neurontin) ==================================================================== For clinical consultation, please call [phone]. ====================================================================    UDS interpretation: No unexpected findings.          Medication Assessment Form: Not applicable. No opioids. Treatment compliance: Not applicable Risk Assessment Profile: Aberrant behavior: See initial evaluations. None observed or detected today Comorbid factors increasing risk of overdose: See initial evaluation. No additional risks detected today Opioid risk tool (ORT):     10/02/2021   11:01 AM  Opioid Risk   Alcohol 0  Illegal Drugs 0  Rx Drugs 0  Alcohol 0  Illegal Drugs 0  Rx Drugs 0  Psychological Disease 0  Depression 0  Opioid Risk Tool Scoring 0  Opioid Risk Interpretation Low Risk    ORT Scoring interpretation table:  Score <3 = Low Risk for SUD  Score between 4-7 = Moderate Risk for SUD  Score >8 = High Risk for Opioid Abuse   Risk of substance use disorder (SUD): Low  Risk Mitigation Strategies:  Patient opioid safety counseling: No controlled substances prescribed. Patient-Prescriber Agreement (PPA): No agreement signed.  Controlled substance notification to other providers: None required. No opioid therapy.  Pharmacologic Plan: Non-opioid analgesic therapy offered. Interventional alternatives discussed.             Laboratory Chemistry Profile   Renal Lab Results  Component Value Date   BUN 8 10/14/2017   CREATININE 1.24 10/14/2017   GFRAA >60 10/14/2017   GFRNONAA >60 10/14/2017   PROTEINUR 100 (A) 03/08/2020     Electrolytes Lab Results  Component Value Date   NA 136 10/14/2017   K 3.9 10/14/2017   CL 104 10/14/2017   CALCIUM 8.7 (L) 10/14/2017     Hepatic No results found for: "AST", "ALT", "ALBUMIN", "ALKPHOS", "AMYLASE", "LIPASE", "AMMONIA"   ID No results found for: "LYMEIGGIGMAB", "HIV", "SARSCOV2NAA", "STAPHAUREUS",  "MRSAPCR", "HCVAB", "PREGTESTUR", "RMSFIGG", "QFVRPH1IGG", "QFVRPH2IGG"   Bone No results found for: "VD25OH", "ZD638VF6EPP", "IR5188CZ6", "SA6301SW1", "25OHVITD1", "25OHVITD2", "09NATFTD3", "TESTOFREE", "TESTOSTERONE"   Endocrine Lab Results  Component Value Date   GLUCOSE 126 (H) 10/14/2017   GLUCOSEU NEGATIVE 03/08/2020     Neuropathy No results found for: "VITAMINB12", "FOLATE", "HGBA1C", "HIV"   CNS No results found for: "COLORCSF", "APPEARCSF", "RBCCOUNTCSF", "WBCCSF", "POLYSCSF", "LYMPHSCSF", "EOSCSF", "PROTEINCSF", "GLUCCSF", "JCVIRUS", "CSFOLI", "IGGCSF", "LABACHR", "ACETBL"   Inflammation (CRP: Acute  ESR: Chronic) No results found for: "CRP", "ESRSEDRATE", "LATICACIDVEN"   Rheumatology No results found for: "RF", "ANA", "LABURIC", "URICUR", "LYMEIGGIGMAB", "LYMEABIGMQN", "HLAB27"   Coagulation Lab Results  Component Value Date   PLT 304 10/14/2017  Cardiovascular Lab Results  Component Value Date   TROPONINI < 0.02 11/23/2013   HGB 13.6 10/14/2017   HCT 40.1 10/14/2017     Screening No results found for: "SARSCOV2NAA", "COVIDSOURCE", "STAPHAUREUS", "MRSAPCR", "HCVAB", "HIV", "PREGTESTUR"   Cancer No results found for: "CEA", "CA125", "LABCA2"   Allergens No results found for: "ALMOND", "APPLE", "ASPARAGUS", "AVOCADO", "BANANA", "BARLEY", "BASIL", "BAYLEAF", "GREENBEAN", "LIMABEAN", "WHITEBEAN", "BEEFIGE", "REDBEET", "BLUEBERRY", "BROCCOLI", "CABBAGE", "MELON", "CARROT", "CASEIN", "CASHEWNUT", "CAULIFLOWER", "CELERY"     Note: Lab results reviewed.  Recent Diagnostic Imaging Review  Ankle Imaging: Ankle-R DG Complete: Results for orders placed during the hospital encounter of 11/12/20 DG Ankle Complete Right  Narrative CLINICAL DATA:  Right ankle injury, medial ankle pain  EXAM: RIGHT ANKLE - COMPLETE 3+ VIEW  COMPARISON:  None.  FINDINGS: There is an avulsion fracture fragment at the medial talus and likely a tiny avulsion fragment of the  medial malleolus. Suspect pilon type fracture of the lateral subtalar facet versus partial bony coalition, not well evaluated by plain film. Extensive soft tissue edema about the medial ankle.  IMPRESSION: 1. There is an avulsion fracture fragment at the medial talus and likely a tiny avulsion fragment of the medial malleolus. 2. Suspect pilon type fracture of the lateral subtalar facet versus partial bony coalition, not well evaluated by plain film. 3. Consider CT to better evaluate suspected fracture anatomy. 4. Extensive soft tissue edema about the medial ankle.   Electronically Signed By: Eddie Candle M.D. On: 11/12/2020 14:48  Ankle-L DG Complete: Results for orders placed during the hospital encounter of 07/20/16 DG Ankle Complete Left  Narrative CLINICAL DATA:  Initial evaluation for acute trauma, twisted ankle.  EXAM: LEFT ANKLE COMPLETE - 3+ VIEW  COMPARISON:  None.  FINDINGS: No acute fracture or dislocation. Ankle mortise approximated. Talar dome intact. No joint effusion. Osseous mineralization normal. No significant soft tissue swelling.  IMPRESSION: No acute osseous abnormality about the left ankle.   Electronically Signed By: Jeannine Boga M.D. On: 07/20/2016 20:12  Complexity Note: Imaging results reviewed.                         Meds   Current Outpatient Medications:    diclofenac Sodium (VOLTAREN) 1 % GEL, Apply 2 g topically 4 (four) times daily., Disp: , Rfl:    gabapentin (NEURONTIN) 400 MG capsule, gabapentin 400 mg capsule, Disp: , Rfl:    amitriptyline (ELAVIL) 10 MG tablet, Take 10 mg by mouth at bedtime. (Patient not taking: Reported on 01/30/2022), Disp: , Rfl:   ROS  Constitutional: Denies any fever or chills Gastrointestinal: No reported hemesis, hematochezia, vomiting, or acute GI distress Musculoskeletal: Denies any acute onset joint swelling, redness, loss of ROM, or weakness Neurological: No reported episodes of acute  onset apraxia, aphasia, dysarthria, agnosia, amnesia, paralysis, loss of coordination, or loss of consciousness  Allergies  Mr. Oyervides is allergic to hydrocodone and penicillins.  [place]  Drug: Mr. Clausen  reports no history of drug use. Alcohol:  reports current alcohol use. Tobacco:  reports that he quit smoking about 5 years ago. His smoking use included cigarettes. He smoked an average of .5 packs per day. He has never used smokeless tobacco. Medical:  has no past medical history on file. Surgical: Mr. Eland  has a past surgical history that includes Knee arthroscopy (Left). Family: family history includes Healthy in his mother.  Constitutional Exam  General appearance: Well nourished, well developed, and well hydrated. In  no apparent acute distress Vitals:   01/30/22 1337  BP: (!) 124/97  Pulse: 95  Resp: 16  Temp: (!) 97.3 F (36.3 C)  TempSrc: Temporal  SpO2: 98%  Weight: 180 lb (81.6 kg)  Height: 6' (1.829 m)   BMI Assessment: Estimated body mass index is 24.41 kg/m as calculated from the following:   Height as of this encounter: 6' (1.829 m).   Weight as of this encounter: 180 lb (81.6 kg).  BMI interpretation table: BMI level Category Range association with higher incidence of chronic pain  <18 kg/m2 Underweight   18.5-24.9 kg/m2 Ideal body weight   25-29.9 kg/m2 Overweight Increased incidence by 20%  30-34.9 kg/m2 Obese (Class I) Increased incidence by 68%  35-39.9 kg/m2 Severe obesity (Class II) Increased incidence by 136%  >40 kg/m2 Extreme obesity (Class III) Increased incidence by 254%   Patient's current BMI Ideal Body weight  Body mass index is 24.41 kg/m. Ideal body weight: 77.6 kg (171 lb 1.2 oz) Adjusted ideal body weight: 79.2 kg (174 lb 10.3 oz)   BMI Readings from Last 4 Encounters:  01/30/22 24.41 kg/m  10/02/21 23.06 kg/m  11/12/20 23.06 kg/m  03/08/20 24.41 kg/m   Wt Readings from Last 4 Encounters:  01/30/22 180 lb (81.6 kg)   10/02/21 170 lb (77.1 kg)  11/12/20 170 lb (77.1 kg)  03/08/20 180 lb (81.6 kg)    Psych/Mental status: Alert, oriented x 3 (person, place, & time)       Eyes: PERLA Respiratory: No evidence of acute respiratory distress  Assessment & Plan  Primary Diagnosis & Pertinent Problem List: The primary encounter diagnosis was Chronic pain syndrome. Diagnoses of Chronic ankle pain (1ry area of Pain) (Right), Neuropathy of sural nerve (Right), Injury of lateral plantar nerve, sequela (Right), and Closed fracture of talus, sequela (Right) were also pertinent to this visit.  Visit Diagnosis: 1. Chronic pain syndrome   2. Chronic ankle pain (1ry area of Pain) (Right)   3. Neuropathy of sural nerve (Right)   4. Injury of lateral plantar nerve, sequela (Right)   5. Closed fracture of talus, sequela (Right)    Problems updated and reviewed during this visit: Problem  Neuropathy of sural nerve (Right)    Plan of Care  Pharmacotherapy (Medications Ordered): No orders of the defined types were placed in this encounter.  Procedure Orders         Misc procedure     Lab Orders  No laboratory test(s) ordered today   Imaging Orders  No imaging studies ordered today   Referral Orders  No referral(s) requested today    Pharmacological management:  Opioid Analgesics: I will not be prescribing any opioids at this time Membrane stabilizer: I will not be prescribing any at this time Muscle relaxant: I will not be prescribing any at this time NSAID: I will not be prescribing any at this time Other analgesic(s): I will not be prescribing any at this time      Interventional Therapies  Risk  Complexity Considerations:   Estimated body mass index is 23.06 kg/m as calculated from the following:   Height as of this encounter: 6' (1.829 m).   Weight as of this encounter: 170 lb (77.1 kg). WNL   Planned  Pending:   Diagnostic/therapeutic right sural and plantar nerve block #1 at the level  of the ankle    Under consideration:   Possible right spinal cord stimulator trial    Completed:   None at this  time   Completed by other providers:   [facility]   Therapeutic  Palliative (PRN) options:   None established    Provider-requested follow-up: Return for procedure (ECT): (R) Sural & Plantar NB #1 (Foot). Recent Visits No visits were found meeting these conditions. Showing recent visits within past 90 days and meeting all other requirements Today's Visits Date Type Provider Dept  01/30/22 Office Visit Milinda Pointer, MD [facility]  Showing today's visits and meeting all other requirements Future Appointments No visits were found meeting these conditions. Showing future appointments within next 90 days and meeting all other requirements  Primary Care Physician: Maryland Pink, MD Note by: Gaspar Cola, MD Date: 01/30/2022; Time: 2:35 PM

## 2022-01-30 ENCOUNTER — Encounter: Payer: Self-pay | Admitting: Pain Medicine

## 2022-01-30 ENCOUNTER — Ambulatory Visit: Payer: PRIVATE HEALTH INSURANCE | Attending: Pain Medicine | Admitting: Pain Medicine

## 2022-01-30 VITALS — BP 124/97 | HR 95 | Temp 97.3°F | Resp 16 | Ht 72.0 in | Wt 180.0 lb

## 2022-01-30 DIAGNOSIS — G894 Chronic pain syndrome: Secondary | ICD-10-CM | POA: Diagnosis present

## 2022-01-30 DIAGNOSIS — S9401XS Injury of lateral plantar nerve, right leg, sequela: Secondary | ICD-10-CM | POA: Diagnosis not present

## 2022-01-30 DIAGNOSIS — S92121S Displaced fracture of body of right talus, sequela: Secondary | ICD-10-CM

## 2022-01-30 DIAGNOSIS — G8929 Other chronic pain: Secondary | ICD-10-CM

## 2022-01-30 DIAGNOSIS — G5781 Other specified mononeuropathies of right lower limb: Secondary | ICD-10-CM | POA: Diagnosis not present

## 2022-01-30 DIAGNOSIS — M25571 Pain in right ankle and joints of right foot: Secondary | ICD-10-CM | POA: Insufficient documentation

## 2022-01-30 NOTE — Patient Instructions (Signed)
______________________________________________________________________  Preparing for Procedure with Sedation  NOTICE: Due to recent regulatory changes, starting on November 19, 2020, procedures requiring intravenous (IV) sedation will no longer be performed at the [facility].  These types of procedures are required to be performed at [facility] ambulatory surgery facility.  We are very sorry for the inconvenience.  Procedure appointments are limited to planned procedures: No Prescription Refills. No disability issues will be discussed. No medication changes will be discussed.  Instructions: Oral Intake: Do not eat or drink anything for at least 8 hours prior to your procedure. (Exception: Blood Pressure Medication. See below.) Transportation: A driver is required. You may not drive yourself after the procedure. Blood Pressure Medicine: Do not forget to take your blood pressure medicine with a sip of water the morning of the procedure. If your Diastolic (lower reading) is above 100 mmHg, elective cases will be cancelled/rescheduled. Blood thinners: These will need to be stopped for procedures. Notify our staff if you are taking any blood thinners. Depending on which one you take, there will be specific instructions on how and when to stop it. Diabetics on insulin: Notify the staff so that you can be scheduled 1st case in the morning. If your diabetes requires high dose insulin, take only  of your normal insulin dose the morning of the procedure and notify the staff that you have done so. Preventing infections: Shower with an antibacterial soap the morning of your procedure. Build-up your immune system: Take 1000 mg of Vitamin C with every meal (3 times a day) the day prior to your procedure. Antibiotics: Inform the staff if you have a condition or reason that requires you to take antibiotics before dental procedures. Pregnancy: If you are pregnant, call and cancel the procedure. Sickness: If  you have a cold, fever, or any active infections, call and cancel the procedure. Arrival: You must be in the facility at least 30 minutes prior to your scheduled procedure. Children: Do not bring children with you. Dress appropriately: There is always the possibility that your clothing may get soiled. Valuables: Do not bring any jewelry or valuables.  Reasons to call and reschedule or cancel your procedure: (Following these recommendations will minimize the risk of a serious complication.) Surgeries: Avoid having procedures within 2 weeks of any surgery. (Avoid for 2 weeks before or after any surgery). Flu Shots: Avoid having procedures within 2 weeks of a flu shots. (Avoid for 2 weeks before or after immunizations). Barium: Avoid having a procedure within 7-10 days after having had a radiological study involving the use of radiological contrast. (Myelograms, Barium swallow or enema study). Heart attacks: Avoid any elective procedures or surgeries for the initial 6 months after a "Myocardial Infarction" (Heart Attack). Blood thinners: It is imperative that you stop these medications before procedures. Let us know if you if you take any blood thinner.  Infection: Avoid procedures during or within two weeks of an infection (including chest colds or gastrointestinal problems). Symptoms associated with infections include: Localized redness, fever, chills, night sweats or profuse sweating, burning sensation when voiding, cough, congestion, stuffiness, runny nose, sore throat, diarrhea, nausea, vomiting, cold or Flu symptoms, recent or current infections. It is specially important if the infection is over the area that we intend to treat. Heart and lung problems: Symptoms that may suggest an active cardiopulmonary problem include: cough, chest pain, breathing difficulties or shortness of breath, dizziness, ankle swelling, uncontrolled high or unusually low blood pressure, and/or palpitations. If you are    experiencing any of these symptoms, cancel your procedure and contact your primary care physician for an evaluation.  Remember:  Regular Business hours are:  Monday to Thursday 8:00 AM to 4:00 PM  Provider's Schedule: Hether Anselmo, MD:  Procedure days: Tuesday and Thursday 7:30 AM to 4:00 PM  Bilal Lateef, MD:  Procedure days: Monday and Wednesday 7:30 AM to 4:00 PM ______________________________________________________________________  ____________________________________________________________________________________________  General Risks and Possible Complications  Patient Responsibilities: It is important that you read this as it is part of your informed consent. It is our duty to inform you of the risks and possible complications associated with treatments offered to you. It is your responsibility as a patient to read this and to ask questions about anything that is not clear or that you believe was not covered in this document.  Patient's Rights: You have the right to refuse treatment. You also have the right to change your mind, even after initially having agreed to have the treatment done. However, under this last option, if you wait until the last second to change your mind, you may be charged for the materials used up to that point.  Introduction: Medicine is not an exact science. Everything in Medicine, including the lack of treatment(s), carries the potential for danger, harm, or loss (which is by definition: Risk). In Medicine, a complication is a secondary problem, condition, or disease that can aggravate an already existing one. All treatments carry the risk of possible complications. The fact that a side effects or complications occurs, does not imply that the treatment was conducted incorrectly. It must be clearly understood that these can happen even when everything is done following the highest safety standards.  No treatment: You can choose not to proceed with the  proposed treatment alternative. The "PRO(s)" would include: avoiding the risk of complications associated with the therapy. The "CON(s)" would include: not getting any of the treatment benefits. These benefits fall under one of three categories: diagnostic; therapeutic; and/or palliative. Diagnostic benefits include: getting information which can ultimately lead to improvement of the disease or symptom(s). Therapeutic benefits are those associated with the successful treatment of the disease. Finally, palliative benefits are those related to the decrease of the primary symptoms, without necessarily curing the condition (example: decreasing the pain from a flare-up of a chronic condition, such as incurable terminal cancer).  General Risks and Complications: These are associated to most interventional treatments. They can occur alone, or in combination. They fall under one of the following six (6) categories: no benefit or worsening of symptoms; bleeding; infection; nerve damage; allergic reactions; and/or death. No benefits or worsening of symptoms: In Medicine there are no guarantees, only probabilities. No healthcare provider can ever guarantee that a medical treatment will work, they can only state the probability that it may. Furthermore, there is always the possibility that the condition may worsen, either directly, or indirectly, as a consequence of the treatment. Bleeding: This is more common if the patient is taking a blood thinner, either prescription or over the counter (example: Goody Powders, Fish oil, Aspirin, Garlic, etc.), or if suffering a condition associated with impaired coagulation (example: Hemophilia, cirrhosis of the liver, low platelet counts, etc.). However, even if you do not have one on these, it can still happen. If you have any of these conditions, or take one of these drugs, make sure to notify your treating physician. Infection: This is more common in patients with a compromised  immune system, either due to disease (example:   diabetes, cancer, human immunodeficiency virus [HIV], etc.), or due to medications or treatments (example: therapies used to treat cancer and rheumatological diseases). However, even if you do not have one on these, it can still happen. If you have any of these conditions, or take one of these drugs, make sure to notify your treating physician. Nerve Damage: This is more common when the treatment is an invasive one, but it can also happen with the use of medications, such as those used in the treatment of cancer. The damage can occur to small secondary nerves, or to large primary ones, such as those in the spinal cord and brain. This damage may be temporary or permanent and it may lead to impairments that can range from temporary numbness to permanent paralysis and/or brain death. Allergic Reactions: Any time a substance or material comes in contact with our body, there is the possibility of an allergic reaction. These can range from a mild skin rash (contact dermatitis) to a severe systemic reaction (anaphylactic reaction), which can result in death. Death: In general, any medical intervention can result in death, most of the time due to an unforeseen complication. ____________________________________________________________________________________________  

## 2022-01-30 NOTE — Progress Notes (Signed)
Safety precautions to be maintained throughout the outpatient stay will include: orient to surroundings, keep bed in low position, maintain call bell within reach at all times, provide assistance with transfer out of bed and ambulation.  
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
## 2022-02-13 ENCOUNTER — Ambulatory Visit: Payer: PRIVATE HEALTH INSURANCE | Attending: Pain Medicine | Admitting: Pain Medicine

## 2022-02-13 ENCOUNTER — Ambulatory Visit: Admission: RE | Admit: 2022-02-13 | Payer: BC Managed Care – PPO | Source: Ambulatory Visit

## 2022-02-13 ENCOUNTER — Encounter: Payer: Self-pay | Admitting: Pain Medicine

## 2022-02-13 VITALS — BP 115/90 | HR 101 | Resp 16 | Ht 72.0 in | Wt 180.0 lb

## 2022-02-13 DIAGNOSIS — S9401XS Injury of lateral plantar nerve, right leg, sequela: Secondary | ICD-10-CM | POA: Diagnosis present

## 2022-02-13 DIAGNOSIS — S8401XS Injury of tibial nerve at lower leg level, right leg, sequela: Secondary | ICD-10-CM | POA: Diagnosis not present

## 2022-02-13 DIAGNOSIS — S92121S Displaced fracture of body of right talus, sequela: Secondary | ICD-10-CM | POA: Diagnosis not present

## 2022-02-13 DIAGNOSIS — M25571 Pain in right ankle and joints of right foot: Secondary | ICD-10-CM | POA: Diagnosis present

## 2022-02-13 DIAGNOSIS — S81801S Unspecified open wound, right lower leg, sequela: Secondary | ICD-10-CM | POA: Diagnosis not present

## 2022-02-13 DIAGNOSIS — G8929 Other chronic pain: Secondary | ICD-10-CM | POA: Insufficient documentation

## 2022-02-13 DIAGNOSIS — G5781 Other specified mononeuropathies of right lower limb: Secondary | ICD-10-CM | POA: Insufficient documentation

## 2022-02-13 DIAGNOSIS — M25471 Effusion, right ankle: Secondary | ICD-10-CM | POA: Insufficient documentation

## 2022-02-13 DIAGNOSIS — M25671 Stiffness of right ankle, not elsewhere classified: Secondary | ICD-10-CM | POA: Diagnosis not present

## 2022-02-13 MED ORDER — FENTANYL CITRATE (PF) 100 MCG/2ML IJ SOLN
INTRAMUSCULAR | Status: AC
  Filled 2022-02-13: qty 2

## 2022-02-13 MED ORDER — FENTANYL CITRATE (PF) 100 MCG/2ML IJ SOLN: 25.0000 ug | INTRAMUSCULAR | Status: DC | PRN

## 2022-02-13 MED ORDER — MIDAZOLAM HCL 5 MG/5ML IJ SOLN
0.5000 mg | Freq: Once | INTRAMUSCULAR | Status: AC
  Administered 2022-02-13: 2 mg via INTRAVENOUS

## 2022-02-13 MED ORDER — ROPIVACAINE HCL 2 MG/ML IJ SOLN
10.0000 mL | Freq: Once | INTRAMUSCULAR | Status: AC
  Administered 2022-02-13: 10 mL via EPIDURAL

## 2022-02-13 MED ORDER — LACTATED RINGERS IV SOLN: Freq: Once | INTRAVENOUS | Status: AC

## 2022-02-13 MED ORDER — METHYLPREDNISOLONE ACETATE 80 MG/ML IJ SUSP
INTRAMUSCULAR | Status: AC
  Filled 2022-02-13: qty 1

## 2022-02-13 MED ORDER — METHYLPREDNISOLONE ACETATE 80 MG/ML IJ SUSP
80.0000 mg | Freq: Once | INTRAMUSCULAR | Status: AC
  Administered 2022-02-13: 80 mg

## 2022-02-13 MED ORDER — LIDOCAINE HCL 2 % IJ SOLN
20.0000 mL | Freq: Once | INTRAMUSCULAR | Status: AC
Start: 2022-02-13 — End: 2022-02-13
  Administered 2022-02-13: 400 mg

## 2022-02-13 MED ORDER — LIDOCAINE HCL 2 % IJ SOLN
INTRAMUSCULAR | Status: AC
  Filled 2022-02-13: qty 20

## 2022-02-13 MED ORDER — MIDAZOLAM HCL 5 MG/5ML IJ SOLN
INTRAMUSCULAR | Status: AC
  Filled 2022-02-13: qty 5

## 2022-02-13 NOTE — Progress Notes (Addendum)
Wasted 3mg  versed and and 176mcg Fentanyl in stericycle with K Tice RN as witness.

## 2022-02-13 NOTE — Patient Instructions (Signed)
____________________________________________________________________________________________  Patient Information update  To: All of our patients.  Re: Name change.  It has been made official that our current name, "[facility]"   will soon be changed to "[facility]".   The purpose of this change is to eliminate any confusion created by the concept of our practice being a "Medication Management Pain Clinic". In the past this has led to the misconception that we treat pain primarily by the use of prescription medications.  Nothing can be farther from the truth.   Understanding PAIN MANAGEMENT: To further understand what our practice does, you first have to understand that "Pain Management" is a subspecialty that requires additional training once a physician has completed their specialty training, which can be in either Anesthesia, Neurology, Psychiatry, or Physical Medicine and Rehabilitation (PMR). Each one of these contributes to the final approach taken by each physician to the management of their patient's pain. To be a "Pain Management Specialist" you must have first completed one of the specialty trainings below.  Anesthesiologists - trained in clinical pharmacology and interventional techniques such as nerve blockade and regional as well as central neuroanatomy. They are trained to block pain before, during, and after surgical interventions.  Neurologists - trained in the diagnosis and pharmacological treatment of complex neurological conditions, such as Multiple Sclerosis, Parkinson's, spinal cord injuries, and other systemic conditions that may be associated with symptoms that may include but are not limited to pain. They tend to rely primarily on the treatment of chronic pain using prescription medications.  Psychiatrist - trained in conditions affecting the psychosocial wellbeing  of patients including but not limited to depression, anxiety, schizophrenia, personality disorders, addiction, and other substance use disorders that may be associated with chronic pain. They tend to rely primarily on the treatment of chronic pain using prescription medications.   Physical Medicine and Rehabilitation (PMR) physicians, also known as physiatrists - trained to treat a wide variety of medical conditions affecting the brain, spinal cord, nerves, bones, joints, ligaments, muscles, and tendons. Their training is primarily aimed at treating patients that have suffered injuries that have caused severe physical impairment. Their training is primarily aimed at the physical therapy and rehabilitation of those patients. They may also work alongside orthopedic surgeons or neurosurgeons using their expertise in assisting surgical patients to recover after their surgeries.  INTERVENTIONAL PAIN MANAGEMENT is sub-subspecialty of Pain Management.  Our physicians are Board-certified in Anesthesia, Pain Management, and Interventional Pain Management.  This meaning that not only have they been trained and Board-certified in their specialty of Anesthesia, and subspecialty of Pain Management, but they have also received further training in the sub-subspecialty of Interventional Pain Management, in order to become Board-certified as INTERVENTIONAL PAIN MANAGEMENT SPECIALIST.    Mission: Our goal is to use our skills in  INTERVENTIONAL PAIN MANAGEMENT as alternatives to the chronic use of prescription opioid medications for the treatment of pain. To make this more clear, we have changed our name to reflect what we do and offer. We will continue to offer medication management assessment and recommendations, but we will not be taking over any patient's medication management.  ____________________________________________________________________________________________   ____________________________________________________________________________________________  Post-Procedure Discharge Instructions  Instructions: Apply ice:  Purpose: This will minimize any swelling and discomfort after procedure.  When: Day of procedure, as soon as you get home. How: Fill a plastic sandwich bag with crushed ice. Cover it with a small towel and   apply to injection site. How long: (15 min on, 15 min off) Apply for 15 minutes then remove x 15 minutes.  Repeat sequence on day of procedure, until you go to bed. Apply heat:  Purpose: To treat any soreness and discomfort from the procedure. When: Starting the next day after the procedure. How: Apply heat to procedure site starting the day following the procedure. How long: May continue to repeat daily, until discomfort goes away. Food intake: Start with clear liquids (like water) and advance to regular food, as tolerated.  Physical activities: Keep activities to a minimum for the first 8 hours after the procedure. After that, then as tolerated. Driving: If you have received any sedation, be responsible and do not drive. You are not allowed to drive for 24 hours after having sedation. Blood thinner: (Applies only to those taking blood thinners) You may restart your blood thinner 6 hours after your procedure. Insulin: (Applies only to Diabetic patients taking insulin) As soon as you can eat, you may resume your normal dosing schedule. Infection prevention: Keep procedure site clean and dry. Shower daily and clean area with soap and water. Post-procedure Pain Diary: Extremely important that this be done correctly and accurately. Recorded information will be used to determine the next step in treatment. For the purpose of accuracy, follow these rules: Evaluate only the area treated. Do not report or include pain from an untreated area. For the purpose of this evaluation, ignore all other areas of pain, except for the treated area. After  your procedure, avoid taking a long nap and attempting to complete the pain diary after you wake up. Instead, set your alarm clock to go off every hour, on the hour, for the initial 8 hours after the procedure. Document the duration of the numbing medicine, and the relief you are getting from it. Do not go to sleep and attempt to complete it later. It will not be accurate. If you received sedation, it is likely that you were given a medication that may cause amnesia. Because of this, completing the diary at a later time may cause the information to be inaccurate. This information is needed to plan your care. Follow-up appointment: Keep your post-procedure follow-up evaluation appointment after the procedure (usually 2 weeks for most procedures, 6 weeks for radiofrequencies). DO NOT FORGET to bring you pain diary with you.   Expect: (What should I expect to see with my procedure?) From numbing medicine (AKA: Local Anesthetics): Numbness or decrease in pain. You may also experience some weakness, which if present, could last for the duration of the local anesthetic. Onset: Full effect within 15 minutes of injected. Duration: It will depend on the type of local anesthetic used. On the average, 1 to 8 hours.  From steroids (Applies only if steroids were used): Decrease in swelling or inflammation. Once inflammation is improved, relief of the pain will follow. Onset of benefits: Depends on the amount of swelling present. The more swelling, the longer it will take for the benefits to be seen. In some cases, up to 10 days. Duration: Steroids will stay in the system x 2 weeks. Duration of benefits will depend on multiple posibilities including persistent irritating factors. Side-effects: If present, they may typically last 2 weeks (the duration of the steroids). Frequent: Cramps (if they occur, drink Gatorade and take over-the-counter Magnesium 450-500 mg once to twice a day); water retention with temporary  weight gain; increases in blood sugar; decreased immune system response; increased appetite. Occasional: Facial   flushing (red, warm cheeks); mood swings; menstrual changes. Uncommon: Long-term decrease or suppression of natural hormones; bone thinning. (These are more common with higher doses or more frequent use. This is why we prefer that our patients avoid having any injection therapies in other practices.)  Very Rare: Severe mood changes; psychosis; aseptic necrosis. From procedure: Some discomfort is to be expected once the numbing medicine wears off. This should be minimal if ice and heat are applied as instructed.  Call if: (When should I call?) You experience numbness and weakness that gets worse with time, as opposed to wearing off. New onset bowel or bladder incontinence. (Applies only to procedures done in the spine)  Emergency Numbers: Durning business hours (Monday - Thursday, 8:00 AM - 4:00 PM) (Friday, 9:00 AM - 12:00 Noon): [phone] After hours: [phone] NOTE: If you are having a problem and are unable connect with, or to talk to a provider, then go to your nearest urgent care or emergency department. If the problem is serious and urgent, please call 911. ____________________________________________________________________________________________   

## 2022-02-13 NOTE — Progress Notes (Signed)
PROVIDER NOTE: Interpretation of information contained herein should be left to medically-trained personnel. Specific patient instructions are provided elsewhere under "Patient Instructions" section of medical record. This document was created in part using STT-dictation technology, any transcriptional errors that may result from this process are unintentional.  Patient: Gary Savage Type: Established DOB: 1975/10/22 MRN: [id number] PCP: Maryland Pink, MD  Service: Procedure DOS: 02/13/2022 Setting: Ambulatory Location: Ambulatory outpatient facility Delivery: Face-to-face Provider: Gaspar Cola, MD Specialty: Interventional Pain Management Specialty designation: 09 Location: Outpatient facility Ref. Prov.: Maryland Pink, MD    Primary Reason for Visit: Interventional Pain Management Treatment. CC: Ankle Pain  Procedure:           Type: Diagnostic Sural  &  Posterior Tibial Nerve & plantar  Nerve Block #1  Laterality: Right  Level: Distal lower extremity.  Imaging: Fluoroscopic guidance Anesthesia: Local anesthesia (1-2% Lidocaine) Anxiolysis: None                 Sedation: Moderate Sedation                       DOS: 02/13/2022  Performed by: Gaspar Cola, MD  Purpose: Diagnostic/Therapeutic Indications: Right foot and ankle pain severe enough to impact quality of life or function. 1. Chronic ankle pain (1ry area of Pain) (Right)   2. Arthralgia of ankle (Right)   3. Injury of lateral plantar nerve, sequela (Right)   4. 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 Closed fracture of talus, sequela (Right)   6. Neuropathy of sural nerve (Right)   7. Pain and swelling of ankle (Right)   8. Stiffness of ankle joint (Right)   9. Swelling of ankle (Right)    NAS-11 Pain score:   Pre-procedure: 4 /10   Post-procedure: 0-No pain/10      Position / Prep / Materials:  Position: Supine Prep solution: DuraPrep (Iodine Povacrylex [0.7% available  iodine] and Isopropyl Alcohol, 74% w/w) Prep Area: Entire distal       lower extremity region  Materials:  Tray: Block Needle(s):  Type: Regular  Gauge (G): 22  Length: 1.5-in  Qty: 2 Pre-op H&P Assessment:  Mr. Chaloux is a 46 y.o. (year old), male patient, seen today for interventional treatment. He  has a past surgical history that includes Knee arthroscopy (Left). Mr. Mccorkle has a current medication list which includes the following prescription(s): amitriptyline, diclofenac sodium, and gabapentin, and the following Facility-Administered Medications: fentanyl. His primarily concern today is the Ankle Pain  Initial Vital Signs:  Pulse/HCG Rate: (!) 101ECG Heart Rate: 84 (NSR) Temp:   Resp: 18 BP: (!) 124/93 SpO2: 100 %  BMI: Estimated body mass index is 24.41 kg/m as calculated from the following:   Height as of this encounter: 6' (1.829 m).   Weight as of this encounter: 180 lb (81.6 kg).  Risk Assessment: Allergies: Reviewed. He is allergic to hydrocodone and penicillins.  Allergy Precautions: None required Coagulopathies: Reviewed. None identified.  Blood-thinner therapy: None at this time Active Infection(s): Reviewed. None identified. Mr. Mizrahi is afebrile  Site Confirmation: Mr. Mcquerry was asked to confirm the procedure and laterality before marking the site Procedure checklist: Completed Consent: Before the procedure and under the influence of no sedative(s), amnesic(s), or anxiolytics, the patient was informed of the treatment options, risks and possible complications. To fulfill our ethical and legal obligations, as recommended by the American Medical Association's Code of Ethics, I have informed the patient  of my clinical impression; the nature and purpose of the treatment or procedure; the risks, benefits, and possible complications of the intervention; the alternatives, including doing nothing; the risk(s) and benefit(s) of the alternative treatment(s) or  procedure(s); and the risk(s) and benefit(s) of doing nothing. The patient was provided information about the general risks and possible complications associated with the procedure. These may include, but are not limited to: failure to achieve desired goals, infection, bleeding, organ or nerve damage, allergic reactions, paralysis, and death. In addition, the patient was informed of those risks and complications associated to the procedure, such as failure to decrease pain; infection; bleeding; organ or nerve damage with subsequent damage to sensory, motor, and/or autonomic systems, resulting in permanent pain, numbness, and/or weakness of one or several areas of the body; allergic reactions; (i.e.: anaphylactic reaction); and/or death. Furthermore, the patient was informed of those risks and complications associated with the medications. These include, but are not limited to: allergic reactions (i.e.: anaphylactic or anaphylactoid reaction(s)); adrenal axis suppression; blood sugar elevation that in diabetics may result in ketoacidosis or comma; water retention that in patients with history of congestive heart failure may result in shortness of breath, pulmonary edema, and decompensation with resultant heart failure; weight gain; swelling or edema; medication-induced neural toxicity; particulate matter embolism and blood vessel occlusion with resultant organ, and/or nervous system infarction; and/or aseptic necrosis of one or more joints. Finally, the patient was informed that Medicine is not an exact science; therefore, there is also the possibility of unforeseen or unpredictable risks and/or possible complications that may result in a catastrophic outcome. The patient indicated having understood very clearly. We have given the patient no guarantees and we have made no promises. Enough time was given to the patient to ask questions, all of which were answered to the patient's satisfaction. Mr. Shieh has  indicated that he wanted to continue with the procedure. Attestation: I, the ordering provider, attest that I have discussed with the patient the benefits, risks, side-effects, alternatives, likelihood of achieving goals, and potential problems during recovery for the procedure that I have provided informed consent. Date  Time: 02/13/2022  8:23 AM  Pre-Procedure Preparation:  Monitoring: As per clinic protocol. Respiration, ETCO2, SpO2, BP, heart rate and rhythm monitor placed and checked for adequate function Safety Precautions: Patient was assessed for positional comfort and pressure points before starting the procedure. Time-out: I initiated and conducted the "Time-out" before starting the procedure, as per protocol. The patient was asked to participate by confirming the accuracy of the "Time Out" information. Verification of the correct person, site, and procedure were performed and confirmed by me, the nursing staff, and the patient. "Time-out" conducted as per Joint Commission's Universal Protocol (UP.01.01.01). Time: [phone]  Description/Narrative of Procedure:          Target:  Anterolateral and anteromedial aspect of the Achilles tendon at the level of the medial and lateral malleolus.  Posterior tibial nerve and sural nerve.  Location: Right ankle area Approach: Percutaneous   Rationale (medical necessity): procedure needed and proper for the diagnosis and/or treatment of the patient's medical symptoms and needs. Procedural Technique Safety Precautions: Aspiration looking for blood return was conducted prior to all injections. At no point did we inject any substances, as a needle was being advanced. No attempts were made at seeking any paresthesias. Safe injection practices and needle disposal techniques used. Medications properly checked for expiration dates. SDV (single dose vial) medications used. Description of the Procedure: Protocol guidelines were  followed. The patient was assisted  into a comfortable position. The target area was identified and the area prepped in the usual manner. Skin & deeper tissues infiltrated with local anesthetic. Appropriate amount of time allowed to pass for local anesthetics to take effect. The procedure needles were then advanced to the target area.  For the sural nerve block, the Achilles tendon and the lateral malleolus are visually identified by palpation. At the level of the superior border of the lateral malleolus and lateral to the Achilles tendon, the needle is inserted with a trajectory aimed toward the lateral malleolus. The needle is advanced until it contacts the lateral malleolus.   Proper needle placement secured. Negative aspiration confirmed.  The medication was subsequently injected as the needle is withdrawn. Solution injected in intermittent fashion, asking for systemic symptoms every 0.5cc of injectate.   In addition to anesthesia in the posterolateral calf and dorsolateral fifth digit, loss of sympathetic tone with color and rubor of the foot may also be indications of a successful block.  The needles were then removed and the area cleansed, making sure to leave some of the prepping solution back to take advantage of its long term bactericidal properties.  For the posterior tibial nerve block, the Achilles tendon and the medial malleolus are visually identified by palpation. At the level of the medial malleolus and medial to the Achilles tendon, the needle is inserted with a trajectory aimed toward the medial malleolus. The needle is advanced until it contacts the medial malleolus.   Proper needle placement secured. Negative aspiration confirmed.  The medication was subsequently injected as the needle is withdrawn. Solution injected in intermittent fashion, asking for systemic symptoms every 0.5cc of injectate.   These nerves supply the intrinsic muscles of the foot, excluding the extensor digitorum brevis. They also supply sensory  innervation to the plantar surface of the foot.  The needles were then removed and the area cleansed, making sure to leave some of the prepping solution back to take advantage of its long term bactericidal properties.          Vitals:   02/13/22 0823 02/13/22 0939 02/13/22 0946 02/13/22 0957  BP:  (!) 124/93 (!) 120/99 (!) 115/90  Pulse:    (!) 101  Resp:  18 18 16   SpO2:  100% 100% 100%  Weight: 180 lb (81.6 kg)     Height: 6' (1.829 m)        Start Time: 0943 hrs. End Time: 0946 hrs.  Materials:  Needle(s) Type: Spinal Needle Gauge: 22G Length: 3.5-in Medication(s): Please see orders for medications and dosing details.  Imaging Guidance (Non-Spinal):          Type of Imaging Technique: Fluoroscopy Guidance (Non-Spinal) Indication(s): Assistance in needle guidance and placement for procedures requiring needle placement in or near specific anatomical locations not easily accessible without such assistance. Exposure Time: Please see nurses notes. Contrast: None used. Fluoroscopic Guidance: I was personally present during the use of fluoroscopy. "Tunnel Vision Technique" used to obtain the best possible view of the target area. Parallax error corrected before commencing the procedure. "Direction-depth-direction" technique used to introduce the needle under continuous pulsed fluoroscopy. Once target was reached, antero-posterior, oblique, and lateral fluoroscopic projection used confirm needle placement in all planes. Images permanently stored in EMR. Interpretation: No contrast injected. I personally interpreted the imaging intraoperatively. Adequate needle placement confirmed in multiple planes. Permanent images saved into the patient's record.  Antibiotic Prophylaxis:   Anti-infectives (From admission, onward)  None      Indication(s): None identified  Post-operative Assessment:  Post-procedure Vital Signs:  Pulse/HCG Rate: (!) 10193 (NSR) Temp:   Resp: 16 BP:  (!) 115/90 SpO2: 100 %  EBL: None  Complications: No immediate post-treatment complications observed by team, or reported by patient.  Note: The patient tolerated the entire procedure well. A repeat set of vitals were taken after the procedure and the patient was kept under observation following institutional policy, for this type of procedure. Post-procedural neurological assessment was performed, showing return to baseline, prior to discharge. The patient was provided with post-procedure discharge instructions, including a section on how to identify potential problems. Should any problems arise concerning this procedure, the patient was given instructions to immediately contact us, at any time, without hesitation. In any case, we plan to contact the patient by telephone for a follow-up status report regarding this interventional procedure.  Comments:  No additional relevant information.  Plan of Care  Orders:  Orders Placed This Encounter  Procedures   Misc procedure    Scheduling Instructions:     Type of Block: Right sural nerve and posterior tibialis nerve block     Side: Right-sided     Sedation: With Sedation.     Timeframe: Today   Informed Consent Details: Physician/Practitioner Attestation; Transcribe to consent form and obtain patient signature    Nursing Order: Transcribe to consent form and obtain patient signature. Note: Always confirm laterality of pain with Mr. Kienle, before procedure.    Order Specific Question:   Physician/Practitioner attestation of informed consent for procedure/surgical case    Answer:   I, the physician/practitioner, attest that I have discussed with the patient the benefits, risks, side effects, alternatives, likelihood of achieving goals and potential problems during recovery for the procedure that I have provided informed consent.    Order Specific Question:   Procedure    Answer:   Right sural nerve and posterior tibialis nerve block    Order  Specific Question:   Physician/Practitioner performing the procedure    Answer:   Elijah Phommachanh A. Laban Emperor, MD    Order Specific Question:   Indication/Reason    Answer:   Right foot and ankle pain   Provide equipment / supplies at bedside    "Block Tray" (Disposable  single use) Needle type: SpinalRegular Amount/quantity: 2 Size: Short(1.5-inch) Gauge: (25G x1) + (22G x1)    Standing Status:   Standing    Number of Occurrences:   1    Order Specific Question:   Specify    Answer:   Block Tray   Chronic Opioid Analgesic:  None MME/day: 0 mg/day   Medications ordered for procedure: Meds ordered this encounter  Medications   lidocaine (XYLOCAINE) 2 % (with pres) injection 400 mg   lactated ringers infusion   midazolam (VERSED) 5 MG/5ML injection 0.5-2 mg    Make sure Flumazenil is available in the pyxis when using this medication. If oversedation occurs, administer 0.2 mg IV over 15 sec. If after 45 sec no response, administer 0.2 mg again over 1 min; may repeat at 1 min intervals; not to exceed 4 doses (1 mg)   fentaNYL (SUBLIMAZE) injection 25-50 mcg    Make sure Narcan is available in the pyxis when using this medication. In the event of respiratory depression (RR< 8/min): Titrate NARCAN (naloxone) in increments of 0.1 to 0.2 mg IV at 2-3 minute intervals, until desired degree of reversal.   methylPREDNISolone acetate (DEPO-MEDROL) injection 80 mg  ropivacaine (PF) 2 mg/mL (0.2%) (NAROPIN) injection 10 mL   Medications administered: We administered lidocaine, lactated ringers, midazolam, methylPREDNISolone acetate, and ropivacaine (PF) 2 mg/mL (0.2%).  See the medical record for exact dosing, route, and time of administration.  Follow-up plan:   Return in about 2 weeks (around 02/27/2022) for Proc-day (T,Th), (F2F), (PPE).       Interventional Therapies  Risk  Complexity Considerations:   Estimated body mass index is 23.06 kg/m as calculated from the following:   Height as of  this encounter: 6' (1.829 m).   Weight as of this encounter: 170 lb (77.1 kg). WNL   Planned  Pending:   Diagnostic/therapeutic right sural and plantar nerve block #1 at the level of the ankle    Under consideration:   Possible right spinal cord stimulator trial    Completed:   None at this time   Completed by other providers:   [facility]   Therapeutic  Palliative (PRN) options:   None established     Recent Visits Date Type Provider Dept  01/30/22 Office Visit Delano MetzNaveira, Tremayne Sheldon, MD [facility]  Showing recent visits within past 90 days and meeting all other requirements Today's Visits Date Type Provider Dept  02/13/22 Procedure visit Delano MetzNaveira, Asna Muldrow, MD [facility]  Showing today's visits and meeting all other requirements Future Appointments Date Type Provider Dept  03/06/22 Appointment Delano MetzNaveira, Jonael Paradiso, MD [facility]  Showing future appointments within next 90 days and meeting all other requirements  Disposition: Discharge home  Discharge (Date  Time): 02/13/2022;   hrs.   Primary Care Physician: Jerl MinaHedrick, James, MD [facility] Note by: Oswaldo DoneFrancisco A Kipper Buch, MD Date: 02/13/2022; Time: 11:07 AM  Disclaimer:  Medicine is not an Visual merchandiserexact science. The only guarantee in medicine is that nothing is guaranteed. It is important to note that the decision to proceed with this intervention was based on the information collected from the patient. The Data and conclusions were drawn from the patient's questionnaire, the interview, and the physical examination. Because the information was provided in large part by the patient, it cannot be guaranteed that it has not been purposely or unconsciously manipulated. Every effort has been made to obtain as much relevant data as possible for this evaluation. It is important to note that the conclusions that lead to this procedure are derived in large part from the  available data. Always take into account that the treatment will also be dependent on availability of resources and existing treatment guidelines, considered by other Pain Management Practitioners as being common knowledge and practice, at the time of the intervention. For Medico-Legal purposes, it is also important to point out that variation in procedural techniques and pharmacological choices are the acceptable norm. The indications, contraindications, technique, and results of the above procedure should only be interpreted and judged by a Board-Certified Interventional Pain Specialist with extensive familiarity and expertise in the same exact procedure and technique.

## 2022-02-14 ENCOUNTER — Telehealth: Payer: Self-pay

## 2022-02-14 NOTE — Telephone Encounter (Signed)
Called PP, no answer, left message to call if needed. 

## 2022-03-06 ENCOUNTER — Encounter: Payer: Self-pay | Admitting: Pain Medicine

## 2022-03-06 ENCOUNTER — Ambulatory Visit: Payer: PRIVATE HEALTH INSURANCE | Attending: Pain Medicine | Admitting: Pain Medicine

## 2022-03-06 VITALS — BP 125/101 | HR 107 | Temp 97.2°F | Resp 14 | Ht 72.0 in | Wt 180.0 lb

## 2022-03-06 DIAGNOSIS — S8401XS Injury of tibial nerve at lower leg level, right leg, sequela: Secondary | ICD-10-CM | POA: Diagnosis not present

## 2022-03-06 DIAGNOSIS — S92121S Displaced fracture of body of right talus, sequela: Secondary | ICD-10-CM | POA: Insufficient documentation

## 2022-03-06 DIAGNOSIS — M25571 Pain in right ankle and joints of right foot: Secondary | ICD-10-CM | POA: Diagnosis not present

## 2022-03-06 DIAGNOSIS — S9401XS Injury of lateral plantar nerve, right leg, sequela: Secondary | ICD-10-CM | POA: Diagnosis not present

## 2022-03-06 DIAGNOSIS — M25471 Effusion, right ankle: Secondary | ICD-10-CM | POA: Insufficient documentation

## 2022-03-06 DIAGNOSIS — S81801S Unspecified open wound, right lower leg, sequela: Secondary | ICD-10-CM | POA: Diagnosis present

## 2022-03-06 DIAGNOSIS — G5781 Other specified mononeuropathies of right lower limb: Secondary | ICD-10-CM | POA: Diagnosis present

## 2022-03-06 DIAGNOSIS — G8929 Other chronic pain: Secondary | ICD-10-CM | POA: Insufficient documentation

## 2022-03-06 NOTE — Patient Instructions (Signed)
____________________________________________________________________________________________  Pain Prevention Technique  Definition:   A technique used to minimize the effects of an activity known to cause inflammation or swelling, which in turn leads to an increase in pain.  Purpose: To prevent swelling from occurring. It is based on the fact that it is easier to prevent swelling from happening than it is to get rid of it, once it occurs.  Contraindications: Anyone with allergy or hypersensitivity to the recommended medications. Anyone taking anticoagulants (Blood Thinners) (e.g., Coumadin, Warfarin, Plavix, etc.). Patients in Renal Failure.  Technique: Before you undertake an activity known to cause pain, or a flare-up of your chronic pain, and before you experience any pain, do the following:  On a full stomach, take 4 (four) over the counter Ibuprofens 200mg  tablets (Motrin), for a total of 800 mg. In addition, take over the counter Magnesium 400 to 500 mg, before doing the activity.  Six (6) hours later, again on a full stomach, repeat the Ibuprofen. That night, take a warm shower and stretch under the running warm water.  This technique may be sufficient to abort the pain and discomfort before it happens. Keep in mind that it takes a lot less medication to prevent swelling than it takes to eliminate it once it occurs.  ____________________________________________________________________________________________  ______________________________________________________________________  Preparing for your procedure  During your procedure appointment there will be: No Prescription Refills. No disability issues to discussed. No medication changes or discussions.  Instructions: Food intake: Avoid eating anything solid for at least 8 hours prior to your procedure. Clear liquid intake: You may take clear liquids such as water up to 2 hours prior to your procedure. (No carbonated  drinks. No soda.) Transportation: Unless otherwise stated by your physician, bring a driver. Morning Medicines: Except for blood thinners, take all of your other morning medications with a sip of water. Make sure to take your heart and blood pressure medicines. If your blood pressure's lower number is above 100, the case will be rescheduled. Blood thinners: If you take a blood thinner, but were not instructed to stop it, call our office [phone] and ask to talk to a nurse. Not stopping a blood thinner prior to certain procedures could lead to serious complications. Diabetics on insulin: Notify the staff so that you can be scheduled 1st case in the morning. If your diabetes requires high dose insulin, take only  of your normal insulin dose the morning of the procedure and notify the staff that you have done so. Preventing infections: Shower with an antibacterial soap the morning of your procedure.  Build-up your immune system: Take 1000 mg of Vitamin C with every meal (3 times a day) the day prior to your procedure. Antibiotics: Inform the nursing staff if you are taking any antibiotics or if you have any conditions that may require antibiotics prior to procedures. (Example: recent joint implants)   Pregnancy: If you are pregnant make sure to notify the nursing staff. Not doing so may result in injury to the fetus, including death.  Sickness: If you have a cold, fever, or any active infections, call and cancel or reschedule your procedure. Receiving steroids while having an infection may result in complications. Arrival: You must be in the facility at least 30 minutes prior to your scheduled procedure. Tardiness: Your scheduled time is also the cutoff time. If you do not arrive at least 15 minutes prior to your procedure, you will be rescheduled.  Children: Do not bring any children with you. Make  arrangements to keep them home. Dress appropriately: There is always a possibility that your clothing  may get soiled. Avoid long dresses. Valuables: Do not bring any jewelry or valuables.  Reasons to call and reschedule or cancel your procedure: (Following these recommendations will minimize the risk of a serious complication.) Surgeries: Avoid having procedures within 2 weeks of any surgery. (Avoid for 2 weeks before or after any surgery). Flu Shots: Avoid having procedures within 2 weeks of a flu shots or . (Avoid for 2 weeks before or after immunizations). Barium: Avoid having a procedure within 7-10 days after having had a radiological study involving the use of radiological contrast. (Myelograms, Barium swallow or enema study). Heart attacks: Avoid any elective procedures or surgeries for the initial 6 months after a "Myocardial Infarction" (Heart Attack). Blood thinners: It is imperative that you stop these medications before procedures. Let us know if you if you take any blood thinner.  Infection: Avoid procedures during or within two weeks of an infection (including chest colds or gastrointestinal problems). Symptoms associated with infections include: Localized redness, fever, chills, night sweats or profuse sweating, burning sensation when voiding, cough, congestion, stuffiness, runny nose, sore throat, diarrhea, nausea, vomiting, cold or Flu symptoms, recent or current infections. It is specially important if the infection is over the area that we intend to treat. Heart and lung problems: Symptoms that may suggest an active cardiopulmonary problem include: cough, chest pain, breathing difficulties or shortness of breath, dizziness, ankle swelling, uncontrolled high or unusually low blood pressure, and/or palpitations. If you are experiencing any of these symptoms, cancel your procedure and contact your primary care physician for an evaluation.  Remember:  Regular Business hours are:  Monday to Thursday 8:00 AM to 4:00 PM  Provider's Schedule: Delano Metz, MD:  Procedure days:  Tuesday and Thursday 7:30 AM to 4:00 PM  Edward Jolly, MD:  Procedure days: Monday and Wednesday 7:30 AM to 4:00 PM  ______________________________________________________________________  ____________________________________________________________________________________________  General Risks and Possible Complications  Patient Responsibilities: It is important that you read this as it is part of your informed consent. It is our duty to inform you of the risks and possible complications associated with treatments offered to you. It is your responsibility as a patient to read this and to ask questions about anything that is not clear or that you believe was not covered in this document.  Patient's Rights: You have the right to refuse treatment. You also have the right to change your mind, even after initially having agreed to have the treatment done. However, under this last option, if you wait until the last second to change your mind, you may be charged for the materials used up to that point.  Introduction: Medicine is not an Visual merchandiser. Everything in Medicine, including the lack of treatment(s), carries the potential for danger, harm, or loss (which is by definition: Risk). In Medicine, a complication is a secondary problem, condition, or disease that can aggravate an already existing one. All treatments carry the risk of possible complications. The fact that a side effects or complications occurs, does not imply that the treatment was conducted incorrectly. It must be clearly understood that these can happen even when everything is done following the highest safety standards.  No treatment: You can choose not to proceed with the proposed treatment alternative. The "PRO(s)" would include: avoiding the risk of complications associated with the therapy. The "CON(s)" would include: not getting any of the treatment benefits. These benefits fall  under one of three categories: diagnostic;  therapeutic; and/or palliative. Diagnostic benefits include: getting information which can ultimately lead to improvement of the disease or symptom(s). Therapeutic benefits are those associated with the successful treatment of the disease. Finally, palliative benefits are those related to the decrease of the primary symptoms, without necessarily curing the condition (example: decreasing the pain from a flare-up of a chronic condition, such as incurable terminal cancer).  General Risks and Complications: These are associated to most interventional treatments. They can occur alone, or in combination. They fall under one of the following six (6) categories: no benefit or worsening of symptoms; bleeding; infection; nerve damage; allergic reactions; and/or death. No benefits or worsening of symptoms: In Medicine there are no guarantees, only probabilities. No healthcare provider can ever guarantee that a medical treatment will work, they can only state the probability that it may. Furthermore, there is always the possibility that the condition may worsen, either directly, or indirectly, as a consequence of the treatment. Bleeding: This is more common if the patient is taking a blood thinner, either prescription or over the counter (example: Goody Powders, Fish oil, Aspirin, Garlic, etc.), or if suffering a condition associated with impaired coagulation (example: Hemophilia, cirrhosis of the liver, low platelet counts, etc.). However, even if you do not have one on these, it can still happen. If you have any of these conditions, or take one of these drugs, make sure to notify your treating physician. Infection: This is more common in patients with a compromised immune system, either due to disease (example: diabetes, cancer, human immunodeficiency virus [HIV], etc.), or due to medications or treatments (example: therapies used to treat cancer and rheumatological diseases). However, even if you do not have one on  these, it can still happen. If you have any of these conditions, or take one of these drugs, make sure to notify your treating physician. Nerve Damage: This is more common when the treatment is an invasive one, but it can also happen with the use of medications, such as those used in the treatment of cancer. The damage can occur to small secondary nerves, or to large primary ones, such as those in the spinal cord and brain. This damage may be temporary or permanent and it may lead to impairments that can range from temporary numbness to permanent paralysis and/or brain death. Allergic Reactions: Any time a substance or material comes in contact with our body, there is the possibility of an allergic reaction. These can range from a mild skin rash (contact dermatitis) to a severe systemic reaction (anaphylactic reaction), which can result in death. Death: In general, any medical intervention can result in death, most of the time due to an unforeseen complication. ____________________________________________________________________________________________

## 2022-03-06 NOTE — Progress Notes (Signed)
PROVIDER NOTE: Information contained herein reflects review and annotations entered in association with encounter. Interpretation of such information and data should be left to medically-trained personnel. Information provided to patient can be located elsewhere in the medical record under "Patient Instructions". Document created using STT-dictation technology, any transcriptional errors that may result from process are unintentional.    Patient: Gary Savage  Service Category: E/M  Provider: Gaspar Cola, MD  DOB: 1976/02/13  DOS: 03/06/2022  Referring Provider: Maryland Pink, MD  MRN: [id number]  Specialty: Interventional Pain Management  PCP: Maryland Pink, MD  Type: Established Patient  Setting: Ambulatory outpatient    Location: Office  Delivery: Face-to-face     HPI  [name], a 46 y.o. year old male, is here today because of his Chronic pain of right ankle [M25.571, G89.29]. Gary Savage primary complain today is No chief complaint on file. Last encounter: My last encounter with him was on 02/13/2022. 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 Pain Assessment: Severity of Chronic pain is reported as a 4 /10. Location: Ankle Right/denies. Onset: More than a month ago. Quality: Sore. Timing: Constant. Modifying factor(s): non weight bearing on r. foot. Vitals:  height is 6' (1.829 m) and weight is 180 lb (81.6 kg). His  temporal temperature is 97.2 F (36.2 C) (abnormal). His blood pressure is 125/101 (abnormal) and his pulse is 107 (abnormal). His respiration is 14 and oxygen saturation is 100%.   Reason for encounter: post-procedure evaluation and assessment.  The patient indicated having attained 100% relief of the pain for the duration of the local anesthetic followed by an ongoing 50% improvement with near complete elimination of the severe burning sensation going down to the bottom of his foot.  Today we had talked about several alternatives including repeating the procedure for the purpose of determining whether or not further treatments and dosing of steroids in the area may provide him with further, better, longer lasting benefit.  The patient indicated being interested in this and we have scheduled him to have the same procedure repeated.  Post-procedure evaluation   Type: Diagnostic Sural  &  Posterior Tibial Nerve & plantar  Nerve Block #1  Laterality: Right  Level: Distal lower extremity.  Imaging: Fluoroscopic guidance Anesthesia: Local anesthesia (1-2% Lidocaine) Anxiolysis: None                 Sedation: Moderate Sedation                       DOS: 02/13/2022  Performed by: Gaspar Cola, MD  Purpose: Diagnostic/Therapeutic Indications: Right foot and ankle pain severe enough to impact quality of life or function. 1. Chronic ankle pain (1ry area of Pain) (Right)   2. Arthralgia of ankle (Right)   3. Injury of lateral plantar nerve, sequela (Right)   4. 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 Closed fracture of talus, sequela (Right)   6. Neuropathy of sural nerve (Right)   7. Pain  and swelling of ankle (Right)   8. Stiffness of ankle joint (Right)   9. Swelling of ankle (Right)    NAS-11 Pain score:   Pre-procedure: 4 /10   Post-procedure: 0-No pain/10      Effectiveness:  Initial hour after procedure: 100 %. Subsequent 4-6 hours post-procedure: 100  %. Analgesia past initial 6 hours: 50 %. Ongoing improvement:  Analgesic: The patient refers currently enjoying an ongoing 50% improvement of the pain in the right foot.  He refers that after the local anesthetic wore off, the really bad burning sensation that he was experiencing in the bottom of his feet improved significantly and in fact has not completely returned. Function: Gary Savage reports improvement in function ROM: Gary Savage reports improvement in ROM  Pharmacotherapy Assessment  Analgesic: None MME/day: 0 mg/day   Monitoring: [place] PMP: PDMP reviewed during this encounter.       Pharmacotherapy: No side-effects or adverse reactions reported. Compliance: No problems identified. Effectiveness: Clinically acceptable.  No notes on file  No results found for: "CBDTHCR" No results found for: "D8THCCBX" No results found for: "D9THCCBX"  UDS:  Summary  Date Value Ref Range Status  10/02/2021 Note  Final    Comment:    ==================================================================== Compliance Drug Analysis, Ur ==================================================================== Test                             Result       Flag       Units  Drug Present and Declared for Prescription Verification   Gabapentin                     PRESENT      EXPECTED  Drug Present not Declared for Prescription Verification   Ephedrine/Pseudoephedrine      PRESENT      UNEXPECTED   Phenylpropanolamine            PRESENT      UNEXPECTED    Source of ephedrine/pseudoephedrine is most commonly pseudoephedrine    in over-the-counter or prescription cold and allergy medications.    Phenylpropanolamine is an expected metabolite of    ephedrine/pseudoephedrine.  ==================================================================== Test                      Result    Flag   Units      Ref Range   Creatinine              30               mg/dL       >=20 ==================================================================== Declared Medications:  The flagging and interpretation on this report are based on the  following declared medications.  Unexpected results may arise from  inaccuracies in the declared medications.   **Note: The testing scope of this panel includes these medications:   Gabapentin (Neurontin) ==================================================================== For clinical consultation, please call [phone]. ====================================================================       ROS  Constitutional: Denies any fever or chills Gastrointestinal: No reported hemesis, hematochezia, vomiting, or acute GI distress Musculoskeletal: Denies any acute onset joint swelling, redness, loss of ROM, or weakness Neurological: No reported episodes of acute onset apraxia, aphasia, dysarthria, agnosia, amnesia, paralysis, loss of coordination, or loss of consciousness  Medication Review  amitriptyline, diclofenac Sodium, and gabapentin  History Review  Allergy: Gary Savage is allergic to hydrocodone and penicillins. Drug: Gary Savage  reports no history of  drug use. Alcohol:  reports current alcohol use. Tobacco:  reports that he quit smoking about 5 years ago. His smoking use included cigarettes. He smoked an average of .5 packs per day. He has never used smokeless tobacco. Social: Gary Savage  reports that he quit smoking about 5 years ago. His smoking use included cigarettes. He smoked an average of .5 packs per day. He has never used smokeless tobacco. He reports current alcohol use. He reports that he does not use drugs. Medical:  has no past medical history on file. Surgical: Gary Savage  has a past surgical history that includes Knee arthroscopy (Left). Family: family history includes Healthy in his mother.  Laboratory Chemistry Profile   Renal Lab Results  Component Value Date   BUN 8 10/14/2017    CREATININE 1.24 10/14/2017   GFRAA >60 10/14/2017   GFRNONAA >60 10/14/2017    Hepatic No results found for: "AST", "ALT", "ALBUMIN", "ALKPHOS", "HCVAB", "AMYLASE", "LIPASE", "AMMONIA"  Electrolytes Lab Results  Component Value Date   NA 136 10/14/2017   K 3.9 10/14/2017   CL 104 10/14/2017   CALCIUM 8.7 (L) 10/14/2017    Bone No results found for: "VD25OH", "VD125OH2TOT", "EL3810FB5", "ZW2585ID7", "25OHVITD1", "25OHVITD2", "25OHVITD3", "TESTOFREE", "TESTOSTERONE"  Inflammation (CRP: Acute Phase) (ESR: Chronic Phase) No results found for: "CRP", "ESRSEDRATE", "LATICACIDVEN"       Note: Above Lab results reviewed.  Recent Imaging Review  NM 3P BONE W/SPECT CT CLINICAL DATA:  Crush injury involving the right ankle with persistent pain. History of multiple fractures.  EXAM: NM BONE SCAN AND SPECT IMAGING  TECHNIQUE: After intravenous injection of radiopharmaceutical, delayed planar images were obtained in multiple projections. Additionally, delayed triplanar SPECT images were obtained through the area of interest.  RADIOPHARMACEUTICALS:  19.16 mCi Tc-69mMDP  COMPARISON:  CT scan 11/12/2020  FINDINGS: Slight asymmetric increased blood flow to the left lower extremity compared to the right.  The immediate static images demonstrate asymmetric increased uptake in the soft tissues of the left lower extremity compared to the right. Increased uptake noted in the region of right ankle joint compared to left.  The delayed images demonstrate increased uptake in the right ankle. This correlates with an ununited fracture of the posterior talus and also probable posttraumatic degenerative changes along the lateral aspect of the posterior talocalcaneal facet. No abnormal uptake in the distal tibia or fibula. No findings to suggest osteochondral lesions or osteonecrosis involving the talus.  IMPRESSION: 1. Increased uptake noted in the ununited fracture involving the posterior  and medial aspect of the talus. 2. 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  Electronically Signed   By: PMarijo SanesM.D.   On: 01/04/2022 12:58 Note: Reviewed        Physical Exam  General appearance: Well nourished, well developed, and well hydrated. In no apparent acute distress Mental status: Alert, oriented x 3 (person, place, & time)       Respiratory: No evidence of acute respiratory distress Eyes: PERLA Vitals: BP (!) 125/101   Pulse (!) 107   Temp (!) 97.2 F (36.2 C) (Temporal)   Resp 14   Ht 6' (1.829 m)   Wt 180 lb (81.6 kg)   SpO2 100%   BMI 24.41 kg/m  BMI: Estimated body mass index is 24.41 kg/m as calculated from the following:   Height as of this encounter: 6' (1.829 m).   Weight as of this encounter: 180 lb (81.6 kg). Ideal:  Ideal body weight: 77.6 kg (171 lb 1.2 oz) Adjusted ideal body weight: 79.2 kg (174 lb 10.3 oz)  Assessment   Diagnosis Status  1. Chronic ankle pain (1ry area of Pain) (Right)   2. Pain and swelling of ankle (Right)   3. Arthralgia of ankle (Right)   4. Neuropathy of sural nerve (Right)   5. Injury of lateral plantar nerve, sequela (Right)   6. 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 Closed fracture of talus, sequela (Right)    Controlled Controlled Controlled   Updated Problems: No problems updated.  Plan of Care  Problem-specific:  No problem-specific Assessment & Plan notes found for this encounter.  Gary Savage has a current medication list which includes the following long-term medication(s): amitriptyline and gabapentin.  Pharmacotherapy (Medications Ordered): No orders of the defined types were placed in this encounter.  Orders:  Orders Placed This Encounter  Procedures   Misc procedure    Type of Block:  Sural &  Posterior Tibial Nerve & plantar  Nerve Block  Side: Right-sided    Standing Status:   Future     Standing Expiration Date:   06/06/2022    Scheduling Instructions:     Sedation: Patient's choice.     Timeframe: ASAA     Blocked time: 20 min   Follow-up plan:   Return for [facility]): (R) sural + plantar NB #2 .     Interventional Therapies  Risk  Complexity Considerations:   Estimated body mass index is 23.06 kg/m as calculated from the following:   Height as of this encounter: 6' (1.829 m).   Weight as of this encounter: 170 lb (77.1 kg). WNL   Planned  Pending:   Diagnostic/therapeutic right sural + plantar NB #2 (ankle level)    Under consideration:   Possible right spinal cord stimulator trial    Completed:   Diagnostic/therapeutic right sural + plantar NB x1 (ankle level) (02/13/2022) (4/10-0/10)   Completed by other providers:   [facility]   Therapeutic  Palliative (PRN) options:   None established    Recent Visits Date Type Provider Dept  02/13/22 Procedure visit Milinda Pointer, MD [facility]  01/30/22 Office Visit Milinda Pointer, MD [facility]  Showing recent visits within past 90 days and meeting all other requirements Today's Visits Date Type Provider Dept  03/06/22 Office Visit Milinda Pointer, MD [facility]  Showing today's visits and meeting all other requirements Future Appointments No visits were found meeting these conditions. Showing future appointments within next 90 days and meeting all other requirements  I discussed the assessment and treatment plan with the patient. The patient was provided an opportunity to ask questions and all were answered. The patient agreed with the plan and demonstrated an understanding of the instructions.  Patient advised to call back or seek an in-person evaluation if the symptoms or condition worsens.  Duration of encounter: 30 minutes.  Total time on encounter, as per AMA guidelines included both the face-to-face and non-face-to-face time personally spent by the  physician and/or other qualified health care professional(s) on the day of the encounter (includes time in activities that require the physician or other qualified health care professional and does not include time in activities normally performed by clinical staff). Physician's time may include the following activities when performed: preparing to see the patient (eg, review of tests, pre-charting review of records) obtaining and/or reviewing separately obtained history performing a medically appropriate examination  and/or evaluation counseling and educating the patient/family/caregiver ordering medications, tests, or procedures referring and communicating with other health care professionals (when not separately reported) documenting clinical information in the electronic or other health record independently interpreting results (not separately reported) and communicating results to the patient/ family/caregiver care coordination (not separately reported)  Note by: Gaspar Cola, MD Date: 03/06/2022; Time: 4:46 PM

## 2022-03-15 NOTE — Progress Notes (Signed)
PROVIDER NOTE: Interpretation of information contained herein should be left to medically-trained personnel. Specific patient instructions are provided elsewhere under "Patient Instructions" section of medical record. This document was created in part using STT-dictation technology, any transcriptional errors that may result from this process are unintentional.  Patient: Gary Savage Type: Established DOB: 09-07-1975 MRN: [id number] PCP: Jerl Mina, MD  Service: Procedure DOS: 03/18/2022 Setting: Ambulatory Location: Ambulatory outpatient facility Delivery: Face-to-face Provider: Oswaldo Done, MD Specialty: Interventional Pain Management Specialty designation: 09 Location: Outpatient facility Ref. Prov.: Jerl Mina, MD    Primary Reason for Visit: Interventional Pain Management Treatment. CC: Ankle Pain (Right ) and Foot Pain (Right )  Procedure:           Type: Diagnostic Sural  &  Posterior Tibialis Nerve Block #2  Laterality: Right  Level: Distal lower extremity.  Imaging: Fluoroscopic guidance Anesthesia: Local anesthesia (1-2% Lidocaine) Anxiolysis: IV Versed 2.0 mg Sedation:                         DOS: 03/18/2022  Performed by: Oswaldo Done, MD  Purpose: Diagnostic/Therapeutic Indications: Right foot and ankle pain severe enough to impact quality of life or function. 1. Chronic ankle pain (1ry area of Pain) (Right)   2. Arthralgia of ankle (Right)   3. Closed fracture of talus, sequela (Right)   4. Injury of lateral plantar nerve, sequela (Right)   5. 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 Neuropathy of sural nerve (Right)   7. Pain and swelling of ankle (Right)   8. 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 Veasey is a 46 y.o. (year old), male patient, seen today for interventional treatment. He  has a past surgical history that includes Knee arthroscopy (Left). Mr. Macauley has a current medication list which includes the following prescription(s): amitriptyline, diclofenac sodium, and gabapentin. His primarily concern today is the Ankle Pain (Right ) and Foot Pain (Right )  Initial Vital Signs:  Pulse/HCG Rate: 85  Temp: (!) 97.3 F (36.3 C) Resp: 16 BP: 111/86 SpO2: 98 %  BMI: Estimated body mass index is 24.41 kg/m as calculated from the following:   Height as of this encounter: 6' (1.829 m).   Weight as of this encounter: 180 lb (81.6 kg).  Risk Assessment: Allergies: Reviewed. He is allergic to hydrocodone and penicillins.  Allergy Precautions: None required Coagulopathies: Reviewed. None identified.  Blood-thinner therapy: None at this time Active Infection(s): Reviewed. None identified. Mr. Jaros is afebrile  Site Confirmation: Mr. Marmolejos was asked to confirm the procedure and laterality before marking the site Procedure checklist: Completed Consent: Before the procedure and under the influence of no sedative(s), amnesic(s), or anxiolytics, the patient was informed of the treatment options, risks and possible complications. To fulfill our ethical and legal obligations, as recommended by the American Medical Association's Code of Ethics, I have informed the patient of my clinical impression; the nature and purpose of the treatment or procedure; the risks, benefits, and possible complications of the  intervention; the alternatives, including doing nothing; the risk(s) and benefit(s) of the alternative treatment(s) or procedure(s); and the risk(s) and benefit(s) of doing  nothing. The patient was provided information about the general risks and possible complications associated with the procedure. These may include, but are not limited to: failure to achieve desired goals, infection, bleeding, organ or nerve damage, allergic reactions, paralysis, and death. In addition, the patient was informed of those risks and complications associated to the procedure, such as failure to decrease pain; infection; bleeding; organ or nerve damage with subsequent damage to sensory, motor, and/or autonomic systems, resulting in permanent pain, numbness, and/or weakness of one or several areas of the body; allergic reactions; (i.e.: anaphylactic reaction); and/or death. Furthermore, the patient was informed of those risks and complications associated with the medications. These include, but are not limited to: allergic reactions (i.e.: anaphylactic or anaphylactoid reaction(s)); adrenal axis suppression; blood sugar elevation that in diabetics may result in ketoacidosis or comma; water retention that in patients with history of congestive heart failure may result in shortness of breath, pulmonary edema, and decompensation with resultant heart failure; weight gain; swelling or edema; medication-induced neural toxicity; particulate matter embolism and blood vessel occlusion with resultant organ, and/or nervous system infarction; and/or aseptic necrosis of one or more joints. Finally, the patient was informed that Medicine is not an exact science; therefore, there is also the possibility of unforeseen or unpredictable risks and/or possible complications that may result in a catastrophic outcome. The patient indicated having understood very clearly. We have given the patient no guarantees and we have made no promises. Enough time was given to the patient to ask questions, all of which were answered to the patient's satisfaction. Mr. Pacific has indicated that he wanted to continue with the  procedure. Attestation: I, the ordering provider, attest that I have discussed with the patient the benefits, risks, side-effects, alternatives, likelihood of achieving goals, and potential problems during recovery for the procedure that I have provided informed consent. Date  Time: 03/18/2022  8:57 AM  Pre-Procedure Preparation:  Monitoring: As per clinic protocol. Respiration, ETCO2, SpO2, BP, heart rate and rhythm monitor placed and checked for adequate function Safety Precautions: Patient was assessed for positional comfort and pressure points before starting the procedure. Time-out: I initiated and conducted the "Time-out" before starting the procedure, as per protocol. The patient was asked to participate by confirming the accuracy of the "Time Out" information. Verification of the correct person, site, and procedure were performed and confirmed by me, the nursing staff, and the patient. "Time-out" conducted as per Joint Commission's Universal Protocol (UP.01.01.01). Time: 1004  Description/Narrative of Procedure:          Target: Anterolateral and anteromedial aspect of the Achilles tendon at the level of the medial and lateral malleolus.  Posterior tibial nerve and sural nerve.  Location: Right ankle area Approach: Percutaneous   Rationale (medical necessity): procedure needed and proper for the diagnosis and/or treatment of the patient's medical symptoms and needs. Procedural Technique Safety Precautions: Aspiration looking for blood return was conducted prior to all injections. At no point did we inject any substances, as a needle was being advanced. No attempts were made at seeking any paresthesias. Safe injection practices and needle disposal techniques used. Medications properly checked for expiration dates. SDV (single dose vial) medications used. Description of the Procedure: Protocol guidelines were followed. The patient was assisted into a comfortable position. The target area was  identified and the area prepped in the usual  manner. Skin & deeper tissues infiltrated with local anesthetic. Appropriate amount of time allowed to pass for local anesthetics to take effect. The procedure needles were then advanced to the target area. Proper needle placement secured. Negative aspiration confirmed. Solution injected in intermittent fashion, asking for systemic symptoms every 0.5cc of injectate. The needles were then removed and the area cleansed, making sure to leave some of the prepping solution back to take advantage of its long term bactericidal properties.           Vitals:   03/18/22 1000 03/18/22 1005 03/18/22 1010 03/18/22 1017  BP: (!) 116/93 (!) 120/95 (!) 121/92 118/88  Pulse: 81 83 78 76  Resp: 20 15 16 15   Temp:      TempSrc:      SpO2: 98% 97% 98% 97%  Weight:      Height:         Start Time: 1004 hrs. End Time: 1011 hrs.  Materials:  Needle(s) Type: Spinal Needle Gauge: 22G Length: 3.5-in Medication(s): Please see orders for medications and dosing details.  Imaging Guidance (Non-Spinal):          Type of Imaging Technique: Fluoroscopy Guidance (Non-Spinal) Indication(s): Assistance in needle guidance and placement for procedures requiring needle placement in or near specific anatomical locations not easily accessible without such assistance. Exposure Time: Please see nurses notes. Contrast: None used. Fluoroscopic Guidance: I was personally present during the use of fluoroscopy. "Tunnel Vision Technique" used to obtain the best possible view of the target area. Parallax error corrected before commencing the procedure. "Direction-depth-direction" technique used to introduce the needle under continuous pulsed fluoroscopy. Once target was reached, antero-posterior, oblique, and lateral fluoroscopic projection used confirm needle placement in all planes. Images permanently stored in EMR. Interpretation: No contrast injected. I personally interpreted the  imaging intraoperatively. Adequate needle placement confirmed in multiple planes. Permanent images saved into the patient's record.  Antibiotic Prophylaxis:   Anti-infectives (From admission, onward)    None      Indication(s): None identified  Post-operative Assessment:  Post-procedure Vital Signs:  Pulse/HCG Rate: 76  Temp: (!) 97.3 F (36.3 C) Resp: 15 BP: 118/88 SpO2: 97 %  EBL: None  Complications: No immediate post-treatment complications observed by team, or reported by patient.  Note: The patient tolerated the entire procedure well. A repeat set of vitals were taken after the procedure and the patient was kept under observation following institutional policy, for this type of procedure. Post-procedural neurological assessment was performed, showing return to baseline, prior to discharge. The patient was provided with post-procedure discharge instructions, including a section on how to identify potential problems. Should any problems arise concerning this procedure, the patient was given instructions to immediately contact us, at any time, without hesitation. In any case, we plan to contact the patient by telephone for a follow-up status report regarding this interventional procedure.  Comments:  No additional relevant information.  Plan of Care  Orders:  Orders Placed This Encounter  Procedures   Misc procedure    Scheduling Instructions:     Type of Block: Right sural nerve and posterior tibialis nerve block     Side: Right-sided     Sedation: With Sedation.     Timeframe: Today   [facility] C-ARM 1-60 MIN NO REPORT    Intraoperative interpretation by procedural physician at [place].    Standing Status:   Standing    Number of Occurrences:   1    Order Specific  Question:   Reason for exam:    Answer:   Assistance in needle guidance and placement for procedures requiring needle placement in or near specific anatomical locations not easily  accessible without such assistance.   Informed Consent Details: Physician/Practitioner Attestation; Transcribe to consent form and obtain patient signature    Nursing Order: Transcribe to consent form and obtain patient signature. Note: Always confirm laterality of pain with Mr. Slee, before procedure.    Order Specific Question:   Physician/Practitioner attestation of informed consent for procedure/surgical case    Answer:   I, the physician/practitioner, attest that I have discussed with the patient the benefits, risks, side effects, alternatives, likelihood of achieving goals and potential problems during recovery for the procedure that I have provided informed consent.    Order Specific Question:   Procedure    Answer:   Right sural nerve and posterior tibialis nerve block #2    Order Specific Question:   Physician/Practitioner performing the procedure    Answer:   Saul Dorsi A. Dossie Arbour, MD    Order Specific Question:   Indication/Reason    Answer:   Chronic right ankle and foot pain   Provide equipment / supplies at bedside    "Block Tray" (Disposable  single use) Skin infiltration needle: Regular - 1.5-in, 25-G, (x1) Block Needle type: Regular Amount/quantity: 1 Size: Short(1.5-inch) Gauge: 22G    Standing Status:   Standing    Number of Occurrences:   1    Order Specific Question:   Specify    Answer:   Block Tray   Chronic Opioid Analgesic:  None MME/day: 0 mg/day   Medications ordered for procedure: Meds ordered this encounter  Medications   lidocaine (XYLOCAINE) 2 % (with pres) injection 400 mg   pentafluoroprop-tetrafluoroeth (GEBAUERS) aerosol   lactated ringers infusion   methylPREDNISolone acetate (DEPO-MEDROL) injection 80 mg   ropivacaine (PF) 2 mg/mL (0.2%) (NAROPIN) injection 10 mL   midazolam (VERSED) injection 0.5-2 mg    Make sure Flumazenil is available in the pyxis when using this medication. If oversedation occurs, administer 0.2 mg IV over 15 sec. If  after 45 sec no response, administer 0.2 mg again over 1 min; may repeat at 1 min intervals; not to exceed 4 doses (1 mg)   Medications administered: We administered lidocaine, pentafluoroprop-tetrafluoroeth, lactated ringers, methylPREDNISolone acetate, ropivacaine (PF) 2 mg/mL (0.2%), and midazolam.  See the medical record for exact dosing, route, and time of administration.  Follow-up plan:   Return in about 2 weeks (around 04/01/2022) for Proc-day (T,Th), (F2F), (PPE).       Interventional Therapies  Risk  Complexity Considerations:   Estimated body mass index is 23.06 kg/m as calculated from the following:   Height as of this encounter: 6' (1.829 m).   Weight as of this encounter: 170 lb (77.1 kg). WNL   Planned  Pending:   Diagnostic/therapeutic right sural + plantar NB #2 (ankle level)    Under consideration:   Possible right spinal cord stimulator trial    Completed:   Diagnostic/therapeutic right sural + plantar NB x1 (ankle level) (02/13/2022) (4/10-0/10)   Completed by other providers:   [facility]   Therapeutic  Palliative (PRN) options:   None established     Recent Visits Date Type Provider Dept  03/06/22 Office Visit Milinda Pointer, MD [facility]  02/13/22 Procedure visit Milinda Pointer, MD [facility]  01/30/22 Office Visit Milinda Pointer, MD [facility]  Showing recent visits within past 24  days and meeting all other requirements Today's Visits Date Type Provider Dept  03/18/22 Procedure visit Milinda Pointer, MD [facility]  Showing today's visits and meeting all other requirements Future Appointments Date Type Provider Dept  04/01/22 Appointment Milinda Pointer, MD [facility]  Showing future appointments within next 90 days and meeting all other requirements  Disposition: Discharge home  Discharge (Date  Time): 03/18/2022; 1026 hrs.   Primary Care Physician: Maryland Pink,  MD Location: [facility] Note by: Gaspar Cola, MD Date: 03/18/2022; Time: 10:45 AM  Disclaimer:  Medicine is not an Chief Strategy Officer. The only guarantee in medicine is that nothing is guaranteed. It is important to note that the decision to proceed with this intervention was based on the information collected from the patient. The Data and conclusions were drawn from the patient's questionnaire, the interview, and the physical examination. Because the information was provided in large part by the patient, it cannot be guaranteed that it has not been purposely or unconsciously manipulated. Every effort has been made to obtain as much relevant data as possible for this evaluation. It is important to note that the conclusions that lead to this procedure are derived in large part from the available data. Always take into account that the treatment will also be dependent on availability of resources and existing treatment guidelines, considered by other Pain Management Practitioners as being common knowledge and practice, at the time of the intervention. For Medico-Legal purposes, it is also important to point out that variation in procedural techniques and pharmacological choices are the acceptable norm. The indications, contraindications, technique, and results of the above procedure should only be interpreted and judged by a Board-Certified Interventional Pain Specialist with extensive familiarity and expertise in the same exact procedure and technique.

## 2022-03-18 ENCOUNTER — Ambulatory Visit
Admission: RE | Admit: 2022-03-18 | Discharge: 2022-03-18 | Disposition: A | Discharge status: Home / Self Care | Payer: Self-pay | Source: Ambulatory Visit | Attending: Pain Medicine | Admitting: Pain Medicine

## 2022-03-18 ENCOUNTER — Encounter: Payer: Self-pay | Admitting: Pain Medicine

## 2022-03-18 ENCOUNTER — Ambulatory Visit: Payer: PRIVATE HEALTH INSURANCE | Attending: Pain Medicine | Admitting: Pain Medicine

## 2022-03-18 VITALS — BP 118/88 | HR 76 | Temp 97.3°F | Resp 15 | Ht 72.0 in | Wt 180.0 lb

## 2022-03-18 DIAGNOSIS — G5781 Other specified mononeuropathies of right lower limb: Secondary | ICD-10-CM | POA: Insufficient documentation

## 2022-03-18 DIAGNOSIS — S81801S Unspecified open wound, right lower leg, sequela: Secondary | ICD-10-CM | POA: Insufficient documentation

## 2022-03-18 DIAGNOSIS — M25571 Pain in right ankle and joints of right foot: Secondary | ICD-10-CM | POA: Insufficient documentation

## 2022-03-18 DIAGNOSIS — S92121S Displaced fracture of body of right talus, sequela: Secondary | ICD-10-CM | POA: Diagnosis present

## 2022-03-18 DIAGNOSIS — G8929 Other chronic pain: Secondary | ICD-10-CM | POA: Insufficient documentation

## 2022-03-18 DIAGNOSIS — S9401XS Injury of lateral plantar nerve, right leg, sequela: Secondary | ICD-10-CM | POA: Diagnosis present

## 2022-03-18 DIAGNOSIS — S92101S Unspecified fracture of right talus, sequela: Secondary | ICD-10-CM | POA: Diagnosis not present

## 2022-03-18 DIAGNOSIS — M19071 Primary osteoarthritis, right ankle and foot: Secondary | ICD-10-CM | POA: Insufficient documentation

## 2022-03-18 DIAGNOSIS — S8401XS Injury of tibial nerve at lower leg level, right leg, sequela: Secondary | ICD-10-CM | POA: Insufficient documentation

## 2022-03-18 DIAGNOSIS — M25471 Effusion, right ankle: Secondary | ICD-10-CM | POA: Diagnosis present

## 2022-03-18 MED ORDER — LIDOCAINE HCL 2 % IJ SOLN
20.0000 mL | Freq: Once | INTRAMUSCULAR | Status: AC
  Administered 2022-03-18: 400 mg

## 2022-03-18 MED ORDER — LIDOCAINE HCL 2 % IJ SOLN
INTRAMUSCULAR | Status: AC
  Filled 2022-03-18: qty 20

## 2022-03-18 MED ORDER — ROPIVACAINE HCL 2 MG/ML IJ SOLN
INTRAMUSCULAR | Status: AC
  Filled 2022-03-18: qty 20

## 2022-03-18 MED ORDER — LACTATED RINGERS IV SOLN: Freq: Once | INTRAVENOUS | Status: AC

## 2022-03-18 MED ORDER — MIDAZOLAM HCL 2 MG/2ML IJ SOLN
INTRAMUSCULAR | Status: AC
  Filled 2022-03-18: qty 2

## 2022-03-18 MED ORDER — ROPIVACAINE HCL 2 MG/ML IJ SOLN
10.0000 mL | Freq: Once | INTRAMUSCULAR | Status: AC
  Administered 2022-03-18: 10 mL

## 2022-03-18 MED ORDER — MIDAZOLAM HCL 2 MG/2ML IJ SOLN
0.5000 mg | Freq: Once | INTRAMUSCULAR | Status: AC
  Administered 2022-03-18: 2 mg via INTRAVENOUS

## 2022-03-18 MED ORDER — PENTAFLUOROPROP-TETRAFLUOROETH EX AERO
INHALATION_SPRAY | Freq: Once | CUTANEOUS | Status: AC
  Administered 2022-03-18: 30 via TOPICAL

## 2022-03-18 MED ORDER — METHYLPREDNISOLONE ACETATE 80 MG/ML IJ SUSP
80.0000 mg | Freq: Once | INTRAMUSCULAR | Status: AC
  Administered 2022-03-18: 80 mg

## 2022-03-18 MED ORDER — METHYLPREDNISOLONE ACETATE 80 MG/ML IJ SUSP
INTRAMUSCULAR | Status: AC
  Filled 2022-03-18: qty 1

## 2022-03-18 NOTE — Patient Instructions (Addendum)
____________________________________________________________________________________________  Post-Procedure Discharge Instructions  Instructions: Apply ice:  Purpose: This will minimize any swelling and discomfort after procedure.  When: Day of procedure, as soon as you get home. How: Fill a plastic sandwich bag with crushed ice. Cover it with a small towel and apply to injection site. How long: (15 min on, 15 min off) Apply for 15 minutes then remove x 15 minutes.  Repeat sequence on day of procedure, until you go to bed. Apply heat:  Purpose: To treat any soreness and discomfort from the procedure. When: Starting the next day after the procedure. How: Apply heat to procedure site starting the day following the procedure. How long: May continue to repeat daily, until discomfort goes away. Food intake: Start with clear liquids (like water) and advance to regular food, as tolerated.  Physical activities: Keep activities to a minimum for the first 8 hours after the procedure. After that, then as tolerated. Driving: If you have received any sedation, be responsible and do not drive. You are not allowed to drive for 24 hours after having sedation. Blood thinner: (Applies only to those taking blood thinners) You may restart your blood thinner 6 hours after your procedure. Insulin: (Applies only to Diabetic patients taking insulin) As soon as you can eat, you may resume your normal dosing schedule. Infection prevention: Keep procedure site clean and dry. Shower daily and clean area with soap and water. Post-procedure Pain Diary: Extremely important that this be done correctly and accurately. Recorded information will be used to determine the next step in treatment. For the purpose of accuracy, follow these rules: Evaluate only the area treated. Do not report or include pain from an untreated area. For the purpose of this evaluation, ignore all other areas of pain, except for the treated  area. After your procedure, avoid taking a long nap and attempting to complete the pain diary after you wake up. Instead, set your alarm clock to go off every hour, on the hour, for the initial 8 hours after the procedure. Document the duration of the numbing medicine, and the relief you are getting from it. Do not go to sleep and attempt to complete it later. It will not be accurate. If you received sedation, it is likely that you were given a medication that may cause amnesia. Because of this, completing the diary at a later time may cause the information to be inaccurate. This information is needed to plan your care. Follow-up appointment: Keep your post-procedure follow-up evaluation appointment after the procedure (usually 2 weeks for most procedures, 6 weeks for radiofrequencies). DO NOT FORGET to bring you pain diary with you.   Expect: (What should I expect to see with my procedure?) From numbing medicine (AKA: Local Anesthetics): Numbness or decrease in pain. You may also experience some weakness, which if present, could last for the duration of the local anesthetic. Onset: Full effect within 15 minutes of injected. Duration: It will depend on the type of local anesthetic used. On the average, 1 to 8 hours.  From steroids (Applies only if steroids were used): Decrease in swelling or inflammation. Once inflammation is improved, relief of the pain will follow. Onset of benefits: Depends on the amount of swelling present. The more swelling, the longer it will take for the benefits to be seen. In some cases, up to 10 days. Duration: Steroids will stay in the system x 2 weeks. Duration of benefits will depend on multiple posibilities including persistent irritating factors. Side-effects: If present, they   may typically last 2 weeks (the duration of the steroids). Frequent: Cramps (if they occur, drink Gatorade and take over-the-counter Magnesium 450-500 mg once to twice a day); water retention with  temporary weight gain; increases in blood sugar; decreased immune system response; increased appetite. Occasional: Facial flushing (red, warm cheeks); mood swings; menstrual changes. Uncommon: Long-term decrease or suppression of natural hormones; bone thinning. (These are more common with higher doses or more frequent use. This is why we prefer that our patients avoid having any injection therapies in other practices.)  Very Rare: Severe mood changes; psychosis; aseptic necrosis. From procedure: Some discomfort is to be expected once the numbing medicine wears off. This should be minimal if ice and heat are applied as instructed.  Call if: (When should I call?) You experience numbness and weakness that gets worse with time, as opposed to wearing off. New onset bowel or bladder incontinence. (Applies only to procedures done in the spine)  Emergency Numbers: Durning business hours (Monday - Thursday, 8:00 AM - 4:00 PM) (Friday, 9:00 AM - 12:00 Noon): [phone] After hours: [phone] NOTE: If you are having a problem and are unable connect with, or to talk to a provider, then go to your nearest urgent care or emergency department. If the problem is serious and urgent, please call 911. ____________________________________________________________________________________________   ____________________________________________________________________________________________  Muscle Spasms & Cramps  Cause(s):  Most common - vitamin and/or electrolyte (calcium, potassium, sodium, etc.) deficiencies. Post procedure - steroids can make your kidneys excrete electrolytes. If you happen to have been borderline low on your electrolytes, it may temporarily triggering cramps & spasms.  Possible triggers: Sweating - causes loss of electrolytes thru the skin. Steroids - causes loss of electrolytes thru the urine.  Treatment: Gatorade (or any other electrolyte-replenishing drink) - Take 1, 8 oz  glass with each meal (3 times a day). OTC (over-the-counter) Magnesium 400 to 500 mg - Take 1 tablet twice a day (one with breakfast and one before bedtime). If you have kidney problems, talk to your primary care physician before taking any Magnesium. Tonic Water with quinine - Take 1, 8 oz glass before bedtime.   ____________________________________________________________________________________________

## 2022-03-19 ENCOUNTER — Telehealth: Payer: Self-pay

## 2022-03-19 NOTE — Telephone Encounter (Signed)
Post procedure follow up.  Patient states he is doing well 

## 2022-04-01 ENCOUNTER — Telehealth: Payer: Self-pay

## 2022-04-01 ENCOUNTER — Ambulatory Visit: Payer: PRIVATE HEALTH INSURANCE | Attending: Pain Medicine | Admitting: Pain Medicine

## 2022-04-01 DIAGNOSIS — S92101S Unspecified fracture of right talus, sequela: Secondary | ICD-10-CM | POA: Diagnosis not present

## 2022-04-01 DIAGNOSIS — M25471 Effusion, right ankle: Secondary | ICD-10-CM | POA: Diagnosis not present

## 2022-04-01 DIAGNOSIS — G8929 Other chronic pain: Secondary | ICD-10-CM

## 2022-04-01 DIAGNOSIS — M25571 Pain in right ankle and joints of right foot: Secondary | ICD-10-CM | POA: Diagnosis not present

## 2022-04-01 DIAGNOSIS — G894 Chronic pain syndrome: Secondary | ICD-10-CM

## 2022-04-01 NOTE — Progress Notes (Signed)
Patient: Gary Savage  Service Category: E/M  Provider: Gaspar Cola, MD  DOB: 08/17/1975  DOS: 04/01/2022  Location: Office  MRN: [id number]  Setting: Ambulatory outpatient  Referring Provider: Maryland Pink, MD  Type: Established Patient  Specialty: Interventional Pain Management  PCP: Maryland Pink, MD  Location: Remote location  Delivery: TeleHealth     Virtual Encounter - Pain Management PROVIDER NOTE: Information contained herein reflects review and annotations entered in association with encounter. Interpretation of such information and data should be left to medically-trained personnel. Information provided to patient can be located elsewhere in the medical record under "Patient Instructions". Document created using STT-dictation technology, any transcriptional errors that may result from process are unintentional.    Contact & Pharmacy Preferred: [phone] Home: [phone] (home) Mobile: [phone] (mobile) E-mail: [email or web address]  [id number] [address] [address]Phone: [phone]: [phone]  Pre-screening  Mr. DYabutoffered "in-person" vs "virtual" encounter. He indicated preferring virtual for this encounter.   Reason COVID-19*  Social distancing based on CDC and AMA recommendations.   I contacted KStefanie Libelon 04/01/2022 via telephone.      I clearly identified myself as FGaspar Cola MD. I verified that I was speaking with the correct person using two identifiers (Name: KMARICELA KAWAHARA and date of birth: 425-Oct-1977.  Consent I sought verbal advanced consent from KStefanie Libelfor virtual visit interactions. I informed Mr. DFaraoneof possible security and privacy concerns, risks, and limitations associated with providing "not-in-person" medical evaluation and management services. I also informed Mr. DAustadof the availability of "in-person"  appointments. Finally, I informed him that there would be a charge for the virtual visit and that he could be  personally, fully or partially, financially responsible for it. Mr. DBoerexpressed understanding and agreed to proceed.   Historic Elements   Mr. KFRANCISCA HARBUCKis a 46y.o. year old, male patient evaluated today after our last contact on 03/18/2022. Mr. DStreety has no past medical history on file. He also  has a past surgical history that includes Knee arthroscopy (Left). Mr. DPelchas a current medication list which includes the following prescription(s): amitriptyline, diclofenac sodium, and gabapentin. He  reports that he quit smoking about 5 years ago. His smoking use included cigarettes. He smoked an average of .5 packs per day. He has never used smokeless tobacco. He reports current alcohol use. He reports that he does not use drugs. Mr. DClausonis allergic to hydrocodone and penicillins.  Estimated body mass index is 24.41 kg/m as calculated from the following:   Height as of 03/18/22: 6' (1.829 m).   Weight as of 03/18/22: 180 lb (81.6 kg).  HPI  Today, he is being contacted for a post-procedure assessment.  The patient was initially scheduled to come in for a face-to-face postprocedure evaluation, but he called indicating that had a sick child at home and was not going to be able to attend the appointment.  We have switched him to a virtual visit.  Today I have attempted to assess whether or not continuing to repeat this injections may be of any benefit.  Although he has improved, he has primarily helped the stinging and burning sensation.  He continues to have the muscle cramps at night and he is also having quite a bit of ankle and foot pain when walking.  Today we talked about several alternatives including the possibility of a spinal  cord stimulator trial and implant.  I have added some information about this and his AVS.  He was instructed to do a little bit of  research on it and to give me a call if he is interested in any of these alternatives or if he has any questions about them.  For the time being, I will not be scheduling him to come back until he has done his research.  Post-procedure evaluation   Type: Diagnostic Sural  &  Posterior Tibialis Nerve Block #2  Laterality: Right  Level: Distal lower extremity.  Imaging: Fluoroscopic guidance Anesthesia: Local anesthesia (1-2% Lidocaine) Anxiolysis: IV Versed 2.0 mg Sedation:                         DOS: 03/18/2022  Performed by: Gaspar Cola, MD  Purpose: Diagnostic/Therapeutic Indications: Right foot and ankle pain severe enough to impact quality of life or function. 1. Chronic ankle pain (1ry area of Pain) (Right)   2. Arthralgia of ankle (Right)   3. Closed fracture of talus, sequela (Right)   4. Injury of lateral plantar nerve, sequela (Right)   5. 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 Neuropathy of sural nerve (Right)   7. Pain and swelling of ankle (Right)   8. 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 Subsequent 4-6 hours post-procedure: 100 %. Analgesia past initial 6 hours: 100 % (lasting 2 days). Ongoing improvement:  Analgesic: This being and burning sensation has improved however, he is having a lot of problems with walking where his ankle is very painful.  This seems to have been aggravated with the weather changes that we have been experiencing. Function: Somewhat improved ROM: Somewhat improved  Pharmacotherapy Assessment   Opioid Analgesic: None MME/day: 0 mg/day   Monitoring: [place] PMP: PDMP reviewed during this encounter.       Pharmacotherapy: No side-effects or adverse reactions reported. Compliance: No problems identified. Effectiveness: Clinically acceptable. Plan: Refer to "POC".  UDS:  Summary  Date Value Ref Range Status  10/02/2021 Note  Final    Comment:    ==================================================================== Compliance Drug Analysis, Ur ==================================================================== Test                             Result       Flag       Units  Drug Present and Declared for Prescription Verification   Gabapentin                     PRESENT      EXPECTED  Drug Present not Declared for Prescription Verification   Ephedrine/Pseudoephedrine      PRESENT      UNEXPECTED   Phenylpropanolamine            PRESENT      UNEXPECTED    Source of ephedrine/pseudoephedrine is most commonly pseudoephedrine    in over-the-counter or prescription cold and allergy medications.    Phenylpropanolamine is an expected metabolite of    ephedrine/pseudoephedrine.  ==================================================================== Test                      Result    Flag   Units      Ref  Range   Creatinine              30               mg/dL      >=20 ==================================================================== Declared Medications:  The flagging and interpretation on this report are based on the  following declared medications.  Unexpected results may arise from  inaccuracies in the declared medications.   **Note: The testing scope of this panel includes these medications:   Gabapentin (Neurontin) ==================================================================== For clinical consultation, please call [phone]. ====================================================================    No results found for: "CBDTHCR", "D8THCCBX", "D9THCCBX"   Laboratory Chemistry Profile   Renal Lab Results  Component Value Date   BUN 8 10/14/2017   CREATININE 1.24 10/14/2017   GFRAA >60 10/14/2017   GFRNONAA >60 10/14/2017    Hepatic No results found for: "AST", "ALT", "ALBUMIN", "ALKPHOS", "HCVAB", "AMYLASE", "LIPASE",  "AMMONIA"  Electrolytes Lab Results  Component Value Date   NA 136 10/14/2017   K 3.9 10/14/2017   CL 104 10/14/2017   CALCIUM 8.7 (L) 10/14/2017    Bone No results found for: "VD25OH", "VD125OH2TOT", "IH4742VZ5", "GL8756EP3", "25OHVITD1", "25OHVITD2", "25OHVITD3", "TESTOFREE", "TESTOSTERONE"  Inflammation (CRP: Acute Phase) (ESR: Chronic Phase) No results found for: "CRP", "ESRSEDRATE", "LATICACIDVEN"       Note: Above Lab results reviewed.  Imaging  DG PAIN CLINIC C-ARM 1-60 MIN NO REPORT Fluoro was used, but no Radiologist interpretation will be provided.  Please refer to "NOTES" tab for provider progress note.  Assessment  The primary encounter diagnosis was Chronic ankle pain (1ry area of Pain) (Right). Diagnoses of Arthralgia of ankle (Right), Pain and swelling of ankle (Right), and Chronic pain syndrome were also pertinent to this visit.  Plan of Care  Problem-specific:  No problem-specific Assessment & Plan notes found for this encounter.  Mr. MARQUELL SAENZ has a current medication list which includes the following long-term medication(s): amitriptyline and gabapentin.  Pharmacotherapy (Medications Ordered): No orders of the defined types were placed in this encounter.  Orders:  No orders of the defined types were placed in this encounter.  Follow-up plan:   Return if symptoms worsen or fail to improve.     Interventional Therapies  Risk  Complexity Considerations:   Estimated body mass index is 23.06 kg/m as calculated from the following:   Height as of this encounter: 6' (1.829 m).   Weight as of this encounter: 170 lb (77.1 kg). WNL   Planned  Pending:   (04/01/2022) patient  provided w/ information regarding SCS trials & implant.     Under consideration:   Possible right spinal cord stimulator trial    Completed:   Diagnostic/therapeutic right sural + plantar NB x2 (03/18/2022) (4-0/10) (100/100/100 x 2 days/<30)   Completed by other  providers:   [facility]   Therapeutic  Palliative (PRN) options:   None established      Recent Visits Date Type Provider Dept  03/18/22 Procedure visit Milinda Pointer, MD [facility]  03/06/22 Office Visit Milinda Pointer, MD [facility]  02/13/22 Procedure visit Milinda Pointer, MD [facility]  01/30/22 Office Visit Milinda Pointer, MD [facility]  Showing recent visits within past 90 days and meeting all other requirements Today's Visits Date Type Provider Dept  04/01/22 Office Visit Milinda Pointer, MD [facility]  Showing today's visits and meeting all other requirements Future Appointments No visits were found meeting these conditions. Showing future appointments within next 90 days and  meeting all other requirements  I discussed the assessment and treatment plan with the patient. The patient was provided an opportunity to ask questions and all were answered. The patient agreed with the plan and demonstrated an understanding of the instructions.  Patient advised to call back or seek an in-person evaluation if the symptoms or condition worsens.  Duration of encounter: 17 minutes.  Note by: Gaspar Cola, MD Date: 04/01/2022; Time: 1:29 PM

## 2022-05-18 NOTE — Progress Notes (Signed)
PROVIDER NOTE: Information contained herein reflects review and annotations entered in association with encounter. Interpretation of such information and data should be left to medically-trained personnel. Information provided to patient can be located elsewhere in the medical record under "Patient Instructions". Document created using STT-dictation technology, any transcriptional errors that may result from process are unintentional.    Patient: Gary Savage  Service Category: E/M  Provider: Gaspar Cola, MD  DOB: 07/09/1975  DOS: 05/19/2022  Referring Provider: Maryland Pink, MD  MRN: [id number]  Specialty: Interventional Pain Management  PCP: Maryland Pink, MD  Type: Established Patient  Setting: Ambulatory outpatient    Location: Office  Delivery: Face-to-face     HPI  [name], a 47 y.o. year old male, is here today because of his No primary diagnosis found.. Gary Savage's primary complain today is No chief complaint on file. Last encounter: My last encounter with him was on 04/01/2022. 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 Pain Assessment: Severity of   is reported as a  /10. Location:    / . Onset:  . Quality:  . Timing:  . Modifying factor(s):  Marland Kitchen Vitals:  vitals were not taken for this visit.  BMI: Estimated body mass index is 24.41 kg/m as calculated from the following:   Height as of 03/18/22: 6'  (1.829 m).   Weight as of 03/18/22: 180 lb (81.6 kg).  Reason for encounter:  *** . ***  Pharmacotherapy Assessment  Analgesic: None MME/day: 0 mg/day   Monitoring: Gary Savage PMP: PDMP reviewed during this encounter.       Pharmacotherapy: No side-effects or adverse reactions reported. Compliance: No problems identified. Effectiveness: Clinically acceptable.  No notes on file  No results found for: "CBDTHCR" No results found for: "D8THCCBX" No results found for: "D9THCCBX"  UDS:  Summary  Date Value Ref Range Status  10/02/2021 Note  Final    Comment:    ==================================================================== Compliance Drug Analysis, Ur ==================================================================== Test                             Result       Flag       Units  Drug Present and Declared for Prescription Verification   Gabapentin                     PRESENT      EXPECTED  Drug Present not Declared for Prescription Verification   Ephedrine/Pseudoephedrine      PRESENT      UNEXPECTED   Phenylpropanolamine            PRESENT      UNEXPECTED    Source of ephedrine/pseudoephedrine is most commonly pseudoephedrine    in over-the-counter or prescription cold and allergy medications.    Phenylpropanolamine is an expected metabolite of    ephedrine/pseudoephedrine.  ==================================================================== Test  Result    Flag   Units      Ref Range   Creatinine              30               mg/dL      >=25 ==================================================================== Declared Medications:  The flagging and interpretation on this report are based on the  following declared medications.  Unexpected results may arise from  inaccuracies in the declared medications.   **Note: The testing scope of this panel includes these medications:   Gabapentin  (Neurontin) ==================================================================== For clinical consultation, please call [phone]. ====================================================================       ROS  Constitutional: Denies any fever or chills Gastrointestinal: No reported hemesis, hematochezia, vomiting, or acute GI distress Musculoskeletal: Denies any acute onset joint swelling, redness, loss of ROM, or weakness Neurological: No reported episodes of acute onset apraxia, aphasia, dysarthria, agnosia, amnesia, paralysis, loss of coordination, or loss of consciousness  Medication Review  amitriptyline, diclofenac Sodium, and gabapentin  History Review  Allergy: [name] is allergic to hydrocodone and penicillins. Drug: [name]  reports no history of drug use. Alcohol:  reports current alcohol use. Tobacco:  reports that he quit smoking about 6 years ago. His smoking use included cigarettes. He smoked an average of .5 packs per day. He has never used smokeless tobacco. Social: [name]  reports that he quit smoking about 6 years ago. His smoking use included cigarettes. He smoked an average of .5 packs per day. He has never used smokeless tobacco. He reports current alcohol use. He reports that he does not use drugs. Medical:  has no past medical history on file. Surgical: [name]  has a past surgical history that includes Knee arthroscopy (Left). Family: family history includes Healthy in his mother.  Laboratory Chemistry Profile   Renal Lab Results  Component Value Date   BUN 8 10/14/2017   CREATININE 1.24 10/14/2017   GFRAA >60 10/14/2017   GFRNONAA >60 10/14/2017    Hepatic No results found for: "AST", "ALT", "ALBUMIN", "ALKPHOS", "HCVAB", "AMYLASE", "LIPASE", "AMMONIA"  Electrolytes Lab Results  Component Value Date   NA 136 10/14/2017   K 3.9 10/14/2017   CL 104 10/14/2017   CALCIUM 8.7 (L) 10/14/2017    Bone No results found for:  "VD25OH", "VD125OH2TOT", "QV9563OV5", "IE3329JJ8", "25OHVITD1", "25OHVITD2", "25OHVITD3", "TESTOFREE", "TESTOSTERONE"  Inflammation (CRP: Acute Phase) (ESR: Chronic Phase) No results found for: "CRP", "ESRSEDRATE", "LATICACIDVEN"       Note: Above Lab results reviewed.  Recent Imaging Review  DG PAIN CLINIC C-ARM 1-60 MIN NO REPORT Fluoro was used, but no Radiologist interpretation will be provided.  Please refer to "NOTES" tab for provider progress note. Note: Reviewed        Physical Exam  General appearance: Well nourished, well developed, and well hydrated. In no apparent acute distress Mental status: Alert, oriented x 3 (person, place, & time)       Respiratory: No evidence of acute respiratory distress Eyes: PERLA Vitals: There were no vitals taken for this visit. BMI: Estimated body mass index is 24.41 kg/m as calculated from the following:   Height as of 03/18/22: 6' (1.829 m).   Weight as of 03/18/22: 180 lb (81.6 kg). Ideal: Patient weight not recorded  Assessment   Diagnosis Status  No diagnosis found. Controlled Controlled Controlled   Updated Problems: No problems updated.  Plan of Care  Problem-specific:  No problem-specific Assessment & Plan notes found for  this encounter.  Mr. FERLIN FAIRHURST has a current medication list which includes the following long-term medication(s): amitriptyline and gabapentin.  Pharmacotherapy (Medications Ordered): No orders of the defined types were placed in this encounter.  Orders:  No orders of the defined types were placed in this encounter.  Follow-up plan:   No follow-ups on file.     Interventional Therapies  Risk  Complexity Considerations:   Estimated body mass index is 23.06 kg/m as calculated from the following:   Height as of this encounter: 6' (1.829 m).   Weight as of this encounter: 170 lb (77.1 kg). WNL   Planned  Pending:   (04/01/2022) patient  provided w/ information regarding SCS trials &  implant.     Under consideration:   Possible right spinal cord stimulator trial    Completed:   Diagnostic/therapeutic right sural + plantar NB x2 (03/18/2022) (4-0/10) (100/100/100 x 2 days/<30)   Completed by other providers:   [facility]   Therapeutic  Palliative (PRN) options:   None established       Recent Visits Date Type Provider Dept  04/01/22 Office Visit Milinda Pointer, MD [facility]  03/18/22 Procedure visit Milinda Pointer, MD [facility]  03/06/22 Office Visit Milinda Pointer, MD [facility]  Showing recent visits within past 90 days and meeting all other requirements Future Appointments Date Type Provider Dept  05/19/22 Appointment Milinda Pointer, MD [facility]  Showing future appointments within next 90 days and meeting all other requirements  I discussed the assessment and treatment plan with the patient. The patient was provided an opportunity to ask questions and all were answered. The patient agreed with the plan and demonstrated an understanding of the instructions.  Patient advised to call back or seek an in-person evaluation if the symptoms or condition worsens.  Duration of encounter: *** minutes.  Total time on encounter, as per AMA guidelines included both the face-to-face and non-face-to-face time personally spent by the physician and/or other qualified health care professional(s) on the day of the encounter (includes time in activities that require the physician or other qualified health care professional and does not include time in activities normally performed by clinical staff). Physician's time may include the following activities when performed: Preparing to see the patient (e.g., pre-charting review of records, searching for previously ordered imaging, lab work, and nerve conduction tests) Review of prior analgesic pharmacotherapies. Reviewing PMP Interpreting ordered tests (e.g., lab  work, imaging, nerve conduction tests) Performing post-procedure evaluations, including interpretation of diagnostic procedures Obtaining and/or reviewing separately obtained history Performing a medically appropriate examination and/or evaluation Counseling and educating the patient/family/caregiver Ordering medications, tests, or procedures Referring and communicating with other health care professionals (when not separately reported) Documenting clinical information in the electronic or other health record Independently interpreting results (not separately reported) and communicating results to the patient/ family/caregiver Care coordination (not separately reported)  Note by: Gaspar Cola, MD Date: 05/19/2022; Time: 6:04 PM

## 2022-05-19 ENCOUNTER — Ambulatory Visit: Payer: PRIVATE HEALTH INSURANCE | Attending: Pain Medicine | Admitting: Pain Medicine

## 2022-05-19 ENCOUNTER — Encounter: Payer: Self-pay | Admitting: Pain Medicine

## 2022-05-19 VITALS — BP 127/92 | HR 105 | Temp 97.6°F | Resp 18 | Ht 72.0 in | Wt 180.0 lb

## 2022-05-19 DIAGNOSIS — M25671 Stiffness of right ankle, not elsewhere classified: Secondary | ICD-10-CM | POA: Insufficient documentation

## 2022-05-19 DIAGNOSIS — G8929 Other chronic pain: Secondary | ICD-10-CM | POA: Diagnosis present

## 2022-05-19 DIAGNOSIS — G5781 Other specified mononeuropathies of right lower limb: Secondary | ICD-10-CM | POA: Diagnosis present

## 2022-05-19 DIAGNOSIS — R9389 Abnormal findings on diagnostic imaging of other specified body structures: Secondary | ICD-10-CM | POA: Insufficient documentation

## 2022-05-19 DIAGNOSIS — M25571 Pain in right ankle and joints of right foot: Secondary | ICD-10-CM | POA: Diagnosis present

## 2022-05-19 DIAGNOSIS — M19071 Primary osteoarthritis, right ankle and foot: Secondary | ICD-10-CM | POA: Insufficient documentation

## 2022-05-19 NOTE — Progress Notes (Signed)
Safety precautions to be maintained throughout the outpatient stay will include: orient to surroundings, keep bed in low position, maintain call bell within reach at all times, provide assistance with transfer out of bed and ambulation.  

## 2022-07-13 NOTE — Progress Notes (Unsigned)
PROVIDER NOTE: Information contained herein reflects review and annotations entered in association with encounter. Interpretation of such information and data should be left to medically-trained personnel. Information provided to patient can be located elsewhere in the medical record under "Patient Instructions". Document created using STT-dictation technology, any transcriptional errors that may result from process are unintentional.    Patient: Gary Savage  Service Category: E/M  Provider: Gaspar Cola, MD  DOB: April 13, 1976  DOS: 07/14/2022  Referring Provider: Maryland Pink, MD  MRN: [id number]  Specialty: Interventional Pain Management  PCP: Maryland Pink, MD  Type: Established Patient  Setting: Ambulatory outpatient    Location: Office  Delivery: Face-to-face     HPI  [name], a 47 y.o. year old male, is here today because of his No primary diagnosis found.. Gary Savage's primary complain today is No chief complaint on file.  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 Pain Assessment: Severity of   is reported as a  /10. Location:    / . Onset:  . Quality:  . Timing:  . Modifying factor(s):  Marland Kitchen Vitals:  vitals were not taken for this visit.  BMI: Estimated body mass index is 24.41 kg/m as calculated from the following:   Height as of 05/19/22: 6' (1.829 m).   Weight as of 05/19/22: 180 lb (81.6 kg). Last  encounter: 05/19/2022. Last procedure: 03/18/2022.  Reason for encounter:  *** . ***  Pharmacotherapy Assessment  Analgesic: None MME/day: 0 mg/day   Monitoring: [place] PMP: PDMP reviewed during this encounter.       Pharmacotherapy: No side-effects or adverse reactions reported. Compliance: No problems identified. Effectiveness: Clinically acceptable.  No notes on file  No results found for: "CBDTHCR" No results found for: "D8THCCBX" No results found for: "D9THCCBX"  UDS:  Summary  Date Value Ref Range Status  10/02/2021 Note  Final    Comment:    ==================================================================== Compliance Drug Analysis, Ur ==================================================================== Test                             Result       Flag       Units  Drug Present and Declared for Prescription Verification   Gabapentin                     PRESENT      EXPECTED  Drug Present not Declared for Prescription Verification   Ephedrine/Pseudoephedrine      PRESENT      UNEXPECTED   Phenylpropanolamine            PRESENT      UNEXPECTED    Source of ephedrine/pseudoephedrine is most commonly pseudoephedrine    in over-the-counter or prescription cold and allergy medications.    Phenylpropanolamine is an expected metabolite of    ephedrine/pseudoephedrine.  ==================================================================== Test  Result    Flag   Units      Ref Range   Creatinine              30               mg/dL      >=20 ==================================================================== Declared Medications:  The flagging and interpretation on this report are based on the  following declared medications.  Unexpected results may arise from  inaccuracies in the declared medications.   **Note: The testing scope of this panel includes these medications:   Gabapentin  (Neurontin) ==================================================================== For clinical consultation, please call [phone]. ====================================================================       ROS  Constitutional: Denies any fever or chills Gastrointestinal: No reported hemesis, hematochezia, vomiting, or acute GI distress Musculoskeletal: Denies any acute onset joint swelling, redness, loss of ROM, or weakness Neurological: No reported episodes of acute onset apraxia, aphasia, dysarthria, agnosia, amnesia, paralysis, loss of coordination, or loss of consciousness  Medication Review  amitriptyline, diclofenac Sodium, and gabapentin  History Review  Allergy: Gary Savage is allergic to hydrocodone and penicillins. Drug: Gary Savage  reports no history of drug use. Alcohol:  reports current alcohol use. Tobacco:  reports that he quit smoking about 6 years ago. His smoking use included cigarettes. He smoked an average of .5 packs per day. He has never used smokeless tobacco. Social: Gary Savage  reports that he quit smoking about 6 years ago. His smoking use included cigarettes. He smoked an average of .5 packs per day. He has never used smokeless tobacco. He reports current alcohol use. He reports that he does not use drugs. Medical:  has no past medical history on file. Surgical: Gary Savage  has a past surgical history that includes Knee arthroscopy (Left). Family: family history includes Healthy in his mother.  Laboratory Chemistry Profile   Renal Lab Results  Component Value Date   BUN 8 10/14/2017   CREATININE 1.24 10/14/2017   GFRAA >60 10/14/2017   GFRNONAA >60 10/14/2017    Hepatic No results found for: "AST", "ALT", "ALBUMIN", "ALKPHOS", "HCVAB", "AMYLASE", "LIPASE", "AMMONIA"  Electrolytes Lab Results  Component Value Date   NA 136 10/14/2017   K 3.9 10/14/2017   CL 104 10/14/2017   CALCIUM 8.7 (L) 10/14/2017    Bone No results found for:  "VD25OH", "VD125OH2TOT", "[id number]", "[id number]", "25OHVITD1", "25OHVITD2", "25OHVITD3", "TESTOFREE", "TESTOSTERONE"  Inflammation (CRP: Acute Phase) (ESR: Chronic Phase) No results found for: "CRP", "ESRSEDRATE", "LATICACIDVEN"       Note: Above Lab results reviewed.  Recent Imaging Review  DG PAIN CLINIC C-ARM 1-60 MIN NO REPORT Fluoro was used, but no Radiologist interpretation will be provided.  Please refer to "NOTES" tab for provider progress note. Note: Reviewed        Physical Exam  General appearance: Well nourished, well developed, and well hydrated. In no apparent acute distress Mental status: Alert, oriented x 3 (person, place, & time)       Respiratory: No evidence of acute respiratory distress Eyes: PERLA Vitals: There were no vitals taken for this visit. BMI: Estimated body mass index is 24.41 kg/m as calculated from the following:   Height as of 05/19/22: 6' (1.829 m).   Weight as of 05/19/22: 180 lb (81.6 kg). Ideal: Patient weight not recorded  Assessment   Diagnosis Status  No diagnosis found. Controlled Controlled Controlled   Updated Problems: No problems updated.  Plan of Care  Problem-specific:  No problem-specific Assessment & Plan notes found for  this encounter.  Mr. TORRION GOULDER has a current medication list which includes the following long-term medication(s): amitriptyline and gabapentin.  Pharmacotherapy (Medications Ordered): No orders of the defined types were placed in this encounter.  Orders:  No orders of the defined types were placed in this encounter.  Follow-up plan:   No follow-ups on file.      Interventional Therapies  Risk  Complexity Considerations:   WNL   Planned  Pending:   Referral to medical psychology for evaluation of spinal cord stimulator implant   Under consideration:   Possible right spinal cord stimulator trial    Completed:   Diagnostic/therapeutic right sural + plantar NB x2 (03/18/2022)  (4-0/10) (100/100/100 x 2 days/<30)   Completed by other providers:   [facility]   Therapeutic  Palliative (PRN) options:   None established         Recent Visits Date Type Provider Dept  05/19/22 Office Visit Milinda Pointer, MD [facility]  Showing recent visits within past 90 days and meeting all other requirements Future Appointments Date Type Provider Dept  07/14/22 Appointment Milinda Pointer, MD [facility]  Showing future appointments within next 90 days and meeting all other requirements  I discussed the assessment and treatment plan with the patient. The patient was provided an opportunity to ask questions and all were answered. The patient agreed with the plan and demonstrated an understanding of the instructions.  Patient advised to call back or seek an in-person evaluation if the symptoms or condition worsens.  Duration of encounter: *** minutes.  Total time on encounter, as per AMA guidelines included both the face-to-face and non-face-to-face time personally spent by the physician and/or other qualified health care professional(s) on the day of the encounter (includes time in activities that require the physician or other qualified health care professional and does not include time in activities normally performed by clinical staff). Physician's time may include the following activities when performed: Preparing to see the patient (e.g., pre-charting review of records, searching for previously ordered imaging, lab work, and nerve conduction tests) Review of prior analgesic pharmacotherapies. Reviewing PMP Interpreting ordered tests (e.g., lab work, imaging, nerve conduction tests) Performing post-procedure evaluations, including interpretation of diagnostic procedures Obtaining and/or reviewing separately obtained history Performing a medically appropriate examination and/or evaluation Counseling and educating the  patient/family/caregiver Ordering medications, tests, or procedures Referring and communicating with other health care professionals (when not separately reported) Documenting clinical information in the electronic or other health record Independently interpreting results (not separately reported) and communicating results to the patient/ family/caregiver Care coordination (not separately reported)  Note by: Gaspar Cola, MD Date: 07/14/2022; Time: 6:33 PM

## 2022-07-14 ENCOUNTER — Ambulatory Visit: Payer: PRIVATE HEALTH INSURANCE | Attending: Pain Medicine | Admitting: Pain Medicine

## 2022-07-14 ENCOUNTER — Encounter: Payer: Self-pay | Admitting: Pain Medicine

## 2022-07-14 VITALS — BP 128/92 | HR 82 | Temp 98.3°F | Resp 14 | Ht 72.0 in | Wt 180.0 lb

## 2022-07-14 DIAGNOSIS — M25571 Pain in right ankle and joints of right foot: Secondary | ICD-10-CM | POA: Diagnosis not present

## 2022-07-14 DIAGNOSIS — M25671 Stiffness of right ankle, not elsewhere classified: Secondary | ICD-10-CM | POA: Insufficient documentation

## 2022-07-14 DIAGNOSIS — S81801S Unspecified open wound, right lower leg, sequela: Secondary | ICD-10-CM | POA: Insufficient documentation

## 2022-07-14 DIAGNOSIS — G5781 Other specified mononeuropathies of right lower limb: Secondary | ICD-10-CM | POA: Insufficient documentation

## 2022-07-14 DIAGNOSIS — M19071 Primary osteoarthritis, right ankle and foot: Secondary | ICD-10-CM | POA: Insufficient documentation

## 2022-07-14 DIAGNOSIS — S8401XS Injury of tibial nerve at lower leg level, right leg, sequela: Secondary | ICD-10-CM | POA: Diagnosis not present

## 2022-07-14 DIAGNOSIS — S92121S Displaced fracture of body of right talus, sequela: Secondary | ICD-10-CM | POA: Diagnosis not present

## 2022-07-14 DIAGNOSIS — S9401XS Injury of lateral plantar nerve, right leg, sequela: Secondary | ICD-10-CM | POA: Insufficient documentation

## 2022-07-14 DIAGNOSIS — M25471 Effusion, right ankle: Secondary | ICD-10-CM | POA: Insufficient documentation

## 2022-07-14 DIAGNOSIS — G8929 Other chronic pain: Secondary | ICD-10-CM | POA: Diagnosis present

## 2022-08-26 ENCOUNTER — Ambulatory Visit: Payer: PRIVATE HEALTH INSURANCE | Attending: Pain Medicine | Admitting: Pain Medicine

## 2022-08-26 ENCOUNTER — Ambulatory Visit
Admission: RE | Admit: 2022-08-26 | Discharge: 2022-08-26 | Disposition: A | Discharge status: Home / Self Care | Payer: Self-pay | Source: Ambulatory Visit | Attending: Pain Medicine | Admitting: Pain Medicine

## 2022-08-26 VITALS — BP 143/99 | HR 88 | Temp 98.0°F | Resp 16 | Ht 72.0 in | Wt 180.0 lb

## 2022-08-26 DIAGNOSIS — S9401XS Injury of lateral plantar nerve, right leg, sequela: Secondary | ICD-10-CM | POA: Insufficient documentation

## 2022-08-26 DIAGNOSIS — M25471 Effusion, right ankle: Secondary | ICD-10-CM | POA: Diagnosis present

## 2022-08-26 DIAGNOSIS — G5781 Other specified mononeuropathies of right lower limb: Secondary | ICD-10-CM | POA: Insufficient documentation

## 2022-08-26 DIAGNOSIS — S92121S Displaced fracture of body of right talus, sequela: Secondary | ICD-10-CM | POA: Diagnosis present

## 2022-08-26 DIAGNOSIS — M25671 Stiffness of right ankle, not elsewhere classified: Secondary | ICD-10-CM | POA: Diagnosis present

## 2022-08-26 DIAGNOSIS — S81801S Unspecified open wound, right lower leg, sequela: Secondary | ICD-10-CM | POA: Diagnosis present

## 2022-08-26 DIAGNOSIS — M25571 Pain in right ankle and joints of right foot: Secondary | ICD-10-CM | POA: Diagnosis not present

## 2022-08-26 DIAGNOSIS — M19071 Primary osteoarthritis, right ankle and foot: Secondary | ICD-10-CM | POA: Insufficient documentation

## 2022-08-26 DIAGNOSIS — S8401XS Injury of tibial nerve at lower leg level, right leg, sequela: Secondary | ICD-10-CM | POA: Insufficient documentation

## 2022-08-26 DIAGNOSIS — Z5189 Encounter for other specified aftercare: Secondary | ICD-10-CM | POA: Insufficient documentation

## 2022-08-26 DIAGNOSIS — G8929 Other chronic pain: Secondary | ICD-10-CM | POA: Insufficient documentation

## 2022-08-26 DIAGNOSIS — S92101S Unspecified fracture of right talus, sequela: Secondary | ICD-10-CM | POA: Diagnosis not present

## 2022-08-26 MED ORDER — LIDOCAINE HCL 2 % IJ SOLN
INTRAMUSCULAR | Status: AC
  Filled 2022-08-26: qty 20

## 2022-08-26 MED ORDER — VANCOMYCIN HCL 1500 MG/300ML IV SOLN
1500.0000 mg | Freq: Once | INTRAVENOUS | Status: AC
  Administered 2022-08-26: 1500 mg via INTRAVENOUS
  Filled 2022-08-26: qty 300

## 2022-08-26 MED ORDER — MIDAZOLAM HCL 5 MG/5ML IJ SOLN
INTRAMUSCULAR | Status: AC
  Filled 2022-08-26: qty 5

## 2022-08-26 MED ORDER — CIPROFLOXACIN HCL 500 MG PO TABS
500.0000 mg | ORAL_TABLET | Freq: Two times a day (BID) | ORAL | 0 refills | Status: AC
Start: 2022-08-26 — End: 2022-09-02

## 2022-08-26 MED ORDER — FENTANYL CITRATE (PF) 100 MCG/2ML IJ SOLN: 25.0000 ug | INTRAMUSCULAR | Status: DC | PRN

## 2022-08-26 MED ORDER — SODIUM CHLORIDE 0.9% FLUSH
10.0000 mL | Freq: Once | INTRAVENOUS | Status: AC
  Administered 2022-08-26: 10 mL

## 2022-08-26 MED ORDER — FENTANYL CITRATE (PF) 100 MCG/2ML IJ SOLN
INTRAMUSCULAR | Status: AC
  Filled 2022-08-26: qty 2

## 2022-08-26 MED ORDER — MIDAZOLAM BOLUS VIA INFUSION: 1.0000 mg | INTRAVENOUS | Status: DC | PRN

## 2022-08-26 MED ORDER — ROPIVACAINE HCL 2 MG/ML IJ SOLN
INTRAMUSCULAR | Status: AC
  Filled 2022-08-26: qty 20

## 2022-08-26 MED ORDER — ROPIVACAINE HCL 2 MG/ML IJ SOLN
20.0000 mL | Freq: Once | INTRAMUSCULAR | Status: AC
  Administered 2022-08-26: 20 mL

## 2022-08-26 MED ORDER — LIDOCAINE HCL 2 % IJ SOLN
20.0000 mL | Freq: Once | INTRAMUSCULAR | Status: AC
  Administered 2022-08-26: 400 mg

## 2022-08-26 MED ORDER — LACTATED RINGERS IV SOLN: Freq: Once | INTRAVENOUS | Status: AC

## 2022-08-26 NOTE — Progress Notes (Signed)
Safety precautions to be maintained throughout the outpatient stay will include: orient to surroundings, keep bed in low position, maintain call bell within reach at all times, provide assistance with transfer out of bed and ambulation.  

## 2022-08-26 NOTE — Progress Notes (Signed)
PROVIDER NOTE: Interpretation of information contained herein should be left to medically-trained personnel. Specific patient instructions are provided elsewhere under "Patient Instructions" section of medical record. This document was created in part using STT-dictation technology, any transcriptional errors that may result from this process are unintentional.  Patient: Gary Savage Type: Established DOB: March 12, 1976 MRN: [id number] PCP: Jerl Mina, MD  Service: Procedure DOS: 08/26/2022 Setting: Ambulatory Location: Ambulatory outpatient facility Delivery: Face-to-face Provider: Oswaldo Done, MD Specialty: Interventional Pain Management Specialty designation: 09 Location: Outpatient facility Ref. Prov.: No ref. provider found       Interventional Therapy   Primary Reason for Admission: Surgical management of chronic pain condition.   Procedure:              Type: Trial Spinal Cord Neurostimulator Implant (Percutaneous, interlaminar, posterior epidural placement) Laterality: Right (-RT)  Level: Lumbar  Imaging: Fluoroscopic guidance Anesthesia: Local anesthesia (1-2% Lidocaine) Anxiolysis: IV  none.   Patient did not keep NPO. Sedation: No Sedation                       DOS: 08/26/2022  Performed by: Oswaldo Done, MD  Purpose: Diagnostic. To determine if a permanent implant may be effective in controlling some or all of Gary Savage's chronic pain symptoms.  Indications: Right ankle and foot pain severe enough to impact quality of life or function. Rationale (medical necessity): procedure needed and proper for the diagnosis and/or treatment of Gary Savage's medical symptoms and needs. 1. Chronic ankle pain (1ry area of Pain) (Right)   2. Closed fracture of talus, sequela (Right)   3. Injury of lateral plantar nerve, sequela (Right)   4. 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 Neuropathy of sural nerve (Right)   6. Pain and swelling of ankle  (Right)   7. 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 Arthralgia of ankle (Right)   9. Stiffness of ankle joint (Right)    NAS-11 Pain score:   Pre-procedure: 5 /10   Post-procedure: 5 /10     Target: Posterior epidural space over the dorsal columns of the spinal cord. Location: Posterior intraspinal canal Region: Thoracolumbar  Approach: Translaminar percutaneous  Type of procedure: Surgical   Position / Prep / Materials:  Position: Prone  Prep solution: DuraPrep (Iodine Povacrylex [0.7% available iodine] and Isopropyl Alcohol, 74% w/w) Prep Area: Entire  Posterior  Lumbosacral  Region  Materials:  Tray: Implant tray Needle(s):  Type: Epidural  Gauge (G): 17  Length: Regular (10cm)  Qty: 1  Pre-op H&P Assessment:  Gary Savage is a 47 y.o. (year old), male patient, seen today for interventional treatment. He  has a past surgical history that includes Knee arthroscopy (Left).  Initial Vital Signs:  Pulse/EKG Rate: (!) 108ECG Heart Rate: 86 Temp: 98 F (36.7 C) Resp: 16 BP: 115/87 SpO2: 99 %  BMI: Estimated body mass index is 24.41 kg/m as calculated from the following:   Height as of this encounter: 6' (1.829 m).   Weight as of this encounter: 180 lb (81.6 kg).  Risk Assessment: Allergies: Reviewed. He is allergic to hydrocodone and penicillins.  Allergy Precautions: None required Coagulopathies: Reviewed. None identified.  Blood-thinner therapy: None at this time Active Infection(s): Reviewed. None identified. Gary Savage is afebrile  Site Confirmation: Gary Savage was asked to confirm the procedure and laterality before marking the site, which he did. Procedure checklist: Completed Consent: Before the procedure and under  the influence of no sedative(s), amnesic(s), or anxiolytics, the patient was informed of the treatment options, risks and possible complications. To fulfill our ethical and legal obligations, as recommended by the  American Medical Association's Code of Ethics, I have informed the patient of my clinical impression; the nature and purpose of the treatment or procedure; the risks, benefits, and possible complications of the intervention; the alternatives, including doing nothing; the risk(s) and benefit(s) of the alternative treatment(s) or procedure(s); and the risk(s) and benefit(s) of doing nothing.  Gary Savage was provided with information about the general risks and possible complications associated with most interventional procedures. These include, but are not limited to: failure to achieve desired goals, infection, bleeding, organ or nerve damage, allergic reactions, paralysis, and/or death.  In addition, he was informed of those risks and possible complications associated to this particular procedure, which include, but are not limited to: damage to the implant; failure to decrease pain; local, systemic, or serious CNS infections, intraspinal abscess with possible cord compression and paralysis, or life-threatening such as meningitis; intrathecal and/or epidural bleeding with formation of hematoma with possible spinal cord compression and permanent paralysis; organ damage; nerve injury or damage with subsequent sensory, motor, and/or autonomic system dysfunction, resulting in transient or permanent pain, numbness, and/or weakness of one or several areas of the body; allergic reactions, either minor or major life-threatening, such as anaphylactic or anaphylactoid reactions.  Furthermore, Gary Savage was informed of those risks and complications associated with the medications. These include, but are not limited to: allergic reactions (i.e.: anaphylactic or anaphylactoid reactions); arrhythmia;  Hypotension/hypertension; cardiovascular collapse; respiratory depression and/or shortness of breath; swelling or edema; medication-induced neural toxicity; particulate matter embolism and blood vessel occlusion with  resultant organ, and/or nervous system infarction and permanent paralysis.  Finally, he was informed that Medicine is not an exact science; therefore, there is also the possibility of unforeseen or unpredictable risks and/or possible complications that may result in a catastrophic outcome. The patient indicated having understood very clearly. We have given the patient no guarantees and we have made no promises. Enough time was given to the patient to ask questions, all of which were answered to the patient's satisfaction. Gary Savage has indicated that he wanted to continue with the procedure. Attestation: I, the ordering provider, attest that I have discussed with the patient the benefits, risks, side-effects, alternatives, likelihood of achieving goals, and potential problems during recovery for the procedure that I have provided informed consent. Date  Time: 08/26/2022  7:56 AM  Pre-Procedure Preparation:  Monitoring: As per clinic protocol. Respiration, ETCO2, SpO2, BP, heart rate and rhythm monitor placed and checked for adequate function Safety Precautions: Patient was assessed for positional comfort and pressure points before starting the procedure. Time-out: I initiated and conducted the "Time-out" before starting the procedure, as per protocol. The patient was asked to participate by confirming the accuracy of the "Time Out" information. Verification of the correct person, site, and procedure were performed and confirmed by me, the nursing staff, and the patient. "Time-out" conducted as per Joint Commission's Universal Protocol (UP.01.01.01). Time: 0832 Start Time: 0832 hrs.  Description/Narrative of Procedure:          Rationale (medical necessity): procedure needed and proper for the diagnosis and/or treatment of the patient's medical symptoms and needs. Procedural Technique Safety Precautions: Aspiration looking for blood return was conducted prior to all injections. At no point did we  inject any substances, as a needle was being advanced. No attempts were  made at seeking any paresthesias. Safe injection practices and needle disposal techniques used. Medications properly checked for expiration dates. SDV (single dose vial) medications used. Description of the Procedure: Protocol guidelines were followed. The patient was assisted into a comfortable position. The target area was identified and the area prepped in the usual manner. Skin & deeper tissues infiltrated with local anesthetic. Appropriate amount of time allowed to pass for local anesthetics to take effect. The procedure needles were then advanced to the target area. Proper needle placement secured. Negative aspiration confirmed. Solution injected in intermittent fashion, asking for systemic symptoms every 0.5cc of injectate. The needles were then removed and the area cleansed, making sure to leave some of the prepping solution back to take advantage of its long term bactericidal properties.  Technical description of procedure: Availability of a responsible, adult driver, and NPO status confirmed. Informed consent was obtained after having discussed risks and possible complications. An IV was started. The patient was then taken to the fluoroscopy suite, where the patient was placed in position for the procedure, over the fluoroscopy table. The patient was then monitored in the usual manner. Fluoroscopy was manipulated to obtain the best possible view of the target. Parallex error was corrected before commencing the procedure. Once a clear view of the target had been obtained, the skin and deeper tissues over the procedure site were infiltrated using lidocaine, loaded in a 10 cc luer-loc syringe with a 0.5 inch, 25-G needle. The introducer needle(s) was/were then inserted through the skin and deeper tissues. A paramidline approach was used to enter the posterior epidural space at a 30 angle, using "Loss-of-resistance Technique" with 3  ml of PF-NaCl (0.9% NSS). Correct needle placement was confirmed in the antero-posterior and lateral fluoroscopic views. The lead was gently introduced and manipulated under real-time fluoroscopy, constantly assessing for pain, discomfort, or paresthesias, until the tip rested at the desired level.       Electrode placement was tested until appropriate coverage was attained. Once the patient confirmed that the stimulation was over the desired area, the lead(s) was/were secured in place and the introducer needles removed. This was done under real-time fluoroscopy while observing the electrode tip to avoid unintended migration. The area was covered with a non-occlusive dressing and the patient transported to recovery for further programming.  Vitals:   08/26/22 0905 08/26/22 0910 08/26/22 0914 08/26/22 0921  BP: (!) 127/95 (!) 125/100 (!) 121/99 (!) 143/99  Pulse:    88  Resp: 20 18 16 16   Temp:    98 F (36.7 C)  SpO2: 100% 100% 100% 99%  Weight:      Height:        Start Time: 0832 hrs. End Time: 0914 hrs.  Neurostimulator Details:   Lead(s):  Brand: Medtronic         Epidural Access Level:  L2-3         Lead implant:  Right   No. of Electrodes/Lead:  8           Laterality:  Right           Top electrode location:  T10         Bottom electrode location:  T11         Model No.: [id number]         Length: 60 cm         Lot No.: [id number]           MRI compatibility:  Yes  External Neurostimulator    Model No.: [id number]   Serial No.: [id number]    Imaging Guidance (Spinal):          Type of Imaging Technique: Fluoroscopy Guidance (Spinal) Indication(s): Assistance in needle guidance and placement for procedures requiring needle placement in or near specific anatomical locations not easily accessible without such assistance. Exposure Time: Please see nurses notes. Contrast: None used. Fluoroscopic Guidance: I was personally present during the use of fluoroscopy. "Tunnel  Vision Technique" used to obtain the best possible view of the target area. Parallax error corrected before commencing the procedure. "Direction-depth-direction" technique used to introduce the needle under continuous pulsed fluoroscopy. Once target was reached, antero-posterior, oblique, and lateral fluoroscopic projection used confirm needle placement in all planes. Images permanently stored in EMR.      Interpretation: No contrast injected. I personally interpreted the imaging intraoperatively. Adequate needle placement confirmed in multiple planes. Permanent images saved into the patient's record.  Antibiotic Prophylaxis:   Anti-infectives (From admission, onward)    Start     Dose/Rate Route Frequency Ordered Stop   08/26/22 0800  vancomycin (VANCOREADY) IVPB 1500 mg/300 mL        1,500 mg 150 mL/hr over 120 Minutes Intravenous  Once 08/26/22 0752 08/26/22 1017   08/26/22 0000  ciprofloxacin (CIPRO) 500 MG tablet        500 mg Oral 2 times daily 08/26/22 0811 09/02/22 2359      Indication(s): None identified  Post-operative Assessment:  Post-procedure Vital Signs:  Pulse/HCG Rate: 8890 Temp: 98 F (36.7 C) Resp: 16 BP: (!) 143/99 SpO2: 99 %  Complications: No immediate post-treatment complications observed by team, or reported by patient.  Note: The patient tolerated the entire procedure well. A repeat set of vitals were taken after the procedure and the patient was kept under observation following institutional policy, for this type of procedure. Post-procedural neurological assessment was performed, showing return to baseline, prior to discharge. The patient was provided with post-procedure discharge instructions, including a section on how to identify potential problems. Should any problems arise concerning this procedure, the patient was given instructions to immediately contact us, at any time, without hesitation. In any case, we plan to contact the patient by telephone for  a follow-up status report regarding this interventional procedure.  Comments:  No additional relevant information.  Plan of Care  Orders:  Orders Placed This Encounter  Procedures   [place] TRIAL    Contact medical implant company representative to make sure they are available to provide required equipment.    Scheduling Instructions:     Side: Right     Level: Lumbar     Device: Medtronic     Sedation: With sedation     Timeframe: Today    Order Specific Question:   Where will this procedure be performed?    Answer:   [facility]   DG PAIN [facility] C-ARM 1-60 MIN NO REPORT    Intraoperative interpretation by procedural physician at [facility].    Standing Status:   Standing    Number of Occurrences:   1    Order Specific Question:   Reason for exam:    Answer:   Assistance in needle guidance and placement for procedures requiring needle placement in or near specific anatomical locations not easily accessible without such assistance.   Follow-up    Post-procedure Phone Call: Call patient tomorrow for routine early follow-up evaluation.  Return Appointment Timeframe: Approximately 6-7 days, to remove  Trial leads.    Standing Status:   Standing    Number of Occurrences:   1    Order Specific Question:   Specify    Answer:   Schedule a return appointment for post-procedure evaluation. In addition arrange for patient to receive a follow-up phone call tomorrow to assess post-procedure status.   Informed Consent Details: Physician/Practitioner Attestation; Transcribe to consent form and obtain patient signature    Note: Always confirm laterality of pain with Mr. Theodorou, before procedure. Transcribe to consent form and obtain patient signature.    Scheduling Instructions:     CPT Code: PERCUTANEOUS IMPLANTATION OF NEUROSTIMULATOR ELECTRODE ARRAY, EPIDURAL (78295)     ICD-10-CM Support Codes:    Order Specific Question:   Physician/Practitioner attestation of informed  consent for procedure/surgical case    Answer:   I, the physician/practitioner, attest that I have discussed with the patient the benefits, risks, side effects, alternatives, likelihood of achieving goals and potential problems during recovery for the procedure that I have provided informed consent.    Order Specific Question:   Procedure    Answer:   Neurostimulator Trial    Order Specific Question:   Physician/Practitioner performing the procedure    Answer:   Klayton Monie A. Laban Emperor, MD    Order Specific Question:   Indication/Reason    Answer:   Regional Chronic Pain Syndrome   Chronic Opioid Analgesic:  None MME/day: 0 mg/day   Medications administered: We administered lactated ringers, lidocaine, ropivacaine (PF) 2 mg/mL (0.2%), sodium chloride flush, and vancomycin HCl.  See the medical record for exact dosing, route, and time of administration.  Follow-up plan:   Return in about 1 week (around 09/02/2022) for [facility]): Removal of SCS trial lead.       Interventional Therapies  Risk  Complexity Considerations:   WNL   Planned  Pending:   Diagnostic right spinal cord stimulator trial (08/26/2022)    Under consideration:   Possible right spinal cord stimulator trial    Completed:   Diagnostic/therapeutic right sural + plantar NB x2 (03/18/2022) (4-0/10) (100/100/100 x 2 days/<30)   Completed by other providers:   [facility]   Therapeutic  Palliative (PRN) options:   None established      Recent Visits Date Type Provider Dept  07/14/22 Office Visit Delano Metz, MD [facility]  Showing recent visits within past 90 days and meeting all other requirements Today's Visits Date Type Provider Dept  08/26/22 Procedure visit Delano Metz, MD [facility]  Showing today's visits and meeting all other requirements Future Appointments Date Type Provider Dept  09/01/22 Appointment Delano Metz, MD [facility]  09/03/22  Appointment Edward Jolly, MD [facility]  Showing future appointments within next 90 days and meeting all other requirements  Disposition: Discharge home  Discharge (Date  Time): 08/26/2022; 0956 hrs.   Primary Care Physician: Jerl Mina, MD Location: [facility] Note by: Oswaldo Done, MD (TTS technology used. I apologize for any typographical errors that were not detected and corrected.) Date: 08/26/2022; Time: 10:21 AM

## 2022-08-26 NOTE — Patient Instructions (Signed)

## 2022-08-27 ENCOUNTER — Telehealth: Payer: Self-pay

## 2022-08-27 NOTE — Telephone Encounter (Signed)
Post procedure follow up.  Patient states he is doing good.  

## 2022-08-31 DIAGNOSIS — M19271 Secondary osteoarthritis, right ankle and foot: Secondary | ICD-10-CM | POA: Insufficient documentation

## 2022-08-31 NOTE — Progress Notes (Unsigned)
PROVIDER NOTE: Interpretation of information contained herein should be left to medically-trained personnel. Specific patient instructions are provided elsewhere under "Patient Instructions" section of medical record. This document was created in part using STT-dictation technology, any transcriptional errors that may result from this process are unintentional.  Patient: LA CABREROS Type: Established DOB: 23-Sep-1975 MRN: [id number] PCP: Jerl Mina, MD  Service: Procedure DOS: 09/01/2022 Setting: Ambulatory Location: Ambulatory outpatient facility Delivery: Face-to-face Provider: Oswaldo Done, MD Specialty: Interventional Pain Management Specialty designation: 09 Location: Outpatient facility Ref. Prov.: Jerl Mina, MD       Interventional Therapy   Primary Reason for Admission: Surgical management of chronic pain condition.  Procedure:  Anesthesia, Analgesia, Anxiolysis:  Type: Removal of Trial Neurostimulator Leads Purpose: End-of-trial Region: Lumbar Laterality: Right-Sided   None required   1. Chronic ankle pain (1ry area of Pain) (Right)   2. Arthralgia of ankle (Right)   3. Closed fracture of talus, sequela (Right)   4. Injury of lateral plantar nerve, sequela (Right)   5. 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 Neuropathy of sural nerve (Right)   7. Stiffness of ankle joint (Right)   8. 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    NAS-11 Pain score:   Pre-procedure: 3 /10   Post-procedure: 3 /10   Neuromodulation Implant Therapy Assessment  Epidural Neurostimulator implant: Side-effects or Adverse reactions: None reported Effectiveness: Described as relatively effective, allowing for increase in activities of daily living (ADL) Plan: No changes in programming  Post-procedure evaluation   Type: Trial Spinal Cord Neurostimulator Implant (Percutaneous, interlaminar, posterior epidural placement) Laterality: Right (-RT)   Level: Lumbar  Imaging: Fluoroscopic guidance Anesthesia: Local anesthesia (1-2% Lidocaine) Anxiolysis: IV  none.   Patient did not keep NPO. Sedation: No Sedation                       DOS: 08/26/2022  Performed by: Oswaldo Done, MD  Purpose: Diagnostic. To determine if a permanent implant may be effective in controlling some or all of Mr. Orser's chronic pain symptoms.  Indications: Right ankle and foot pain severe enough to impact quality of life or function. Rationale (medical necessity): procedure needed and proper for the diagnosis and/or treatment of Mr. Peregrina's medical symptoms and needs. 1. Chronic ankle pain (1ry area of Pain) (Right)   2. Closed fracture of talus, sequela (Right)   3. Injury of lateral plantar nerve, sequela (Right)   4. 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 Neuropathy of sural nerve (Right)   6. Pain and swelling of ankle (Right)   7. 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 Arthralgia of ankle (Right)   9. Stiffness of ankle joint (Right)    NAS-11 Pain score:  Pre-procedure: 5 /10  Post-procedure: 5 /10      Effectiveness:  Initial hour after procedure: 100 %. Subsequent 4-6 hours post-procedure: 100 %. Analgesia past initial 6 hours: 75 %. Ongoing improvement:  Analgesic: The patient indicated having attained 75% improvement of the pain while using the spinal cord stimulator.  However he indicates that what really improved was the fact that he quit having spasms.  He wants to proceed with the permanent implant. Function: Mr. Bower reports improvement in function.  Above all he was extremely happy that the spinal cord stimulator controlled the spasms. ROM: Mr. Trame reports improvement in ROM.  Due to the fact that he had  surgery in the area this is where he encountered the least amount of benefit.  Pre-op Assessment:  Mr. Meade is a 47 y.o. (year old), male patient, seen today for  removal of neurostimulator trial lead(s). He  has a past surgical history that includes Knee arthroscopy (Left).  Initial Vital Signs:  Pulse: (!) 105  Temp: (!) 97.2 F (36.2 C) Resp: 18 BP: (!) 125/97 SpO2: 98 %  BMI: Estimated body mass index is 24.41 kg/m as calculated from the following:   Height as of this encounter: 6' (1.829 m).   Weight as of this encounter: 180 lb (81.6 kg).  Risk Assessment: Allergies: Reviewed. He is allergic to hydrocodone and penicillins.  Allergy Precautions: None required Coagulopathies: Reviewed. None identified.  Blood-thinner therapy: None at this time Active Infection(s): Reviewed. None identified. Mr. Brandow is afebrile  Site Confirmation: Mr. Schlund was asked to confirm the procedure and laterality before marking the site, which he did. Procedure checklist: Completed Consent: Mr. Silerio consents to removal of trial leads. Attestation: I, the ordering provider, attest that I have discussed with the patient the risks and potential problems associated with procedure. Date  Time: 09/01/2022  8:01 AM  Pre-Procedure Preparation:  Monitoring: As per clinic protocol. Respiration, ETCO2, SpO2, BP, heart rate and rhythm monitor placed and checked for adequate function Safety Precautions: Patient was assessed for positional comfort and pressure points before starting the procedure. Time-out: I initiated and conducted the "Time-out" before starting the procedure, as per protocol. The patient was asked to participate by confirming the accuracy of the "Time Out" information. Verification of the correct person, site, and procedure were performed and confirmed by me, the nursing staff, and the patient. "Time-out" conducted as per Joint Commission's Universal Protocol (UP.01.01.01).  Description of Procedure Process:   Position: Sitting Area Prepped: Around implant device site Prepping solution: ChloraPrep (2% chlorhexidine gluconate and 70%  isopropyl alcohol) Safety Precautions: Sterile technique used  Description of the Procedure: Availability of a responsible, adult driver, and NPO status confirmed. Informed consent was obtained after having discussed risks and possible complications. An IV was started. The patient was then taken to the fluoroscopy suite, where the patient was placed in position for the procedure, over the fluoroscopy table. The patient was then monitored in the usual manner. The bandages were removed and the surgical area was prepped using a broad-spectrum topical antiseptic. Meaningful verbal contact was maintained with the patient at all times. The stitches were then removed and the patient asked to mildly flex the spine. Using constant tension over the leads, the electrodes were removed in their entirety, without any apparent complication. The patient tolerated the entire procedure well. Following the performance of this procedure, the patient was kept under observation until the discharge criteria were met. The patient was sent home in stable condition. The patient was provided with discharge instructions, including a section on how to identify potential problems. Should any problems arise concerning this procedure, the patient was given instructions to contact us, without hesitation. In any case, we will contact the patient by telephone for a follow-up status report regarding this interventional procedure.  Vitals:   09/01/22 0801  BP: (!) 125/97  Pulse: (!) 105  Resp: 18  Temp: (!) 97.2 F (36.2 C)  TempSrc: Temporal  SpO2: 98%  Weight: 180 lb (81.6 kg)  Height: 6' (1.829 m)   Materials: Sterile suture removal kit; Sterile gloves; Sterile gauze; Band-aid  Medication(s): N/A  Post-operative Assessment:  Post-procedure Vital Signs:  Pulse/HCG Rate: (!) 105  Temp: (!) 97.2 F (36.2 C) Resp: 18 BP: (!) 125/97 SpO2: 98 %  Complications: No immediate post-treatment complications observed by team, or  reported by patient.  Note: The patient tolerated the entire procedure well. A repeat set of vitals were taken after the procedure and the patient was kept under observation following institutional policy, for this type of procedure. Post-procedural neurological assessment was performed, showing return to baseline, prior to discharge. The patient was provided with post-procedure discharge instructions, including a section on how to identify potential problems. Should any problems arise concerning this procedure, the patient was given instructions to immediately contact us, at any time, without hesitation. In any case, we plan to contact the patient by telephone for a follow-up status report regarding this interventional procedure.  Comments:  No additional relevant information.  Plan of Care  Orders:  Orders Placed This Encounter  Procedures   Suture removal kit    Please have suture removal kit available in exam room.    Standing Status:   Standing    Number of Occurrences:   1   DG PAIN CLINIC C-ARM 1-60 MIN NO REPORT    Intraoperative interpretation by procedural physician at [facility].    Standing Status:   Standing    Number of Occurrences:   1    Order Specific Question:   Reason for exam:    Answer:   Assistance in needle guidance and placement for procedures requiring needle placement in or near specific anatomical locations not easily accessible without such assistance.   Nursing Instructions:    Please complete this patient's postprocedure evaluation.    Scheduling Instructions:     Please complete this patient's postprocedure evaluation.   Provide equipment / supplies at bedside    "Suture Removal Kit" (Disposable  single use)    Standing Status:   Standing    Number of Occurrences:   1    Order Specific Question:   Specify    Answer:   Suture Removal Kit   Informed Consent Details: Physician/Practitioner Attestation; Transcribe to consent form and obtain  patient signature    Standing Status:   Standing    Number of Occurrences:   1    Order Specific Question:   Physician/Practitioner attestation of informed consent for procedure/surgical case    Answer:   I, the physician/practitioner, attest that I have discussed with the patient the benefits, risks, side effects, alternatives, likelihood of achieving goals and potential problems during recovery for the procedure that I have provided informed consent.    Order Specific Question:   Procedure    Answer:   Removal of permanent epidural neurostimulator implant system    Order Specific Question:   Physician/Practitioner performing the procedure    Answer:   Cleophus Mendonsa A. Laban Emperor, MD    Order Specific Question:   Indication/Reason    Answer:   Nonfunctional/dysfunctional neurostimulator system   Chronic Opioid Analgesic:  None MME/day: 0 mg/day   Medications administered: Angelique Holm. Longan had no medications administered during this visit.  See the medical record for exact dosing, route, and time of administration.  Follow-up plan:   Return for follow-up after permanent implant.       Interventional Therapies  Risk  Complexity Considerations:   Day of implant vs. Removal day images.       Planned  Pending:   The patient has decided to proceed with the permanent implant.  He will be referred to  Dr. Marcell Barlow for permanent implant of the device.   Under consideration:   Possible right spinal cord stimulator trial    Completed:   Diagnostic right spinal cord stimulator trial (08/26/2022) (100/100/75/75 + 100% relief of spasms)  Diagnostic/therapeutic right sural + plantar NB x2 (03/18/2022) (4-0/10) (100/100/100 x 2 days/<30)   Completed by other providers:   [facility]   Therapeutic  Palliative (PRN) options:   None established       Recent Visits Date Type Provider Dept  08/26/22 Procedure visit Delano Metz, MD [facility]  07/14/22 Office Visit  Delano Metz, MD [facility]  Showing recent visits within past 90 days and meeting all other requirements Today's Visits Date Type Provider Dept  09/01/22 Procedure visit Delano Metz, MD [facility]  Showing today's visits and meeting all other requirements Future Appointments No visits were found meeting these conditions. Showing future appointments within next 90 days and meeting all other requirements  Disposition: Discharge home  Discharge (Date  Time): 09/01/2022; 0825 hrs.   Primary Care Physician: Jerl Mina, MD Location: [facility] Note by: Oswaldo Done, MD (TTS technology used. I apologize for any typographical errors that were not detected and corrected.) Date: 09/01/2022; Time: 8:44 AM

## 2022-09-01 ENCOUNTER — Ambulatory Visit: Payer: PRIVATE HEALTH INSURANCE | Attending: Pain Medicine | Admitting: Pain Medicine

## 2022-09-01 ENCOUNTER — Ambulatory Visit
Admission: RE | Admit: 2022-09-01 | Discharge: 2022-09-01 | Disposition: A | Discharge status: Home / Self Care | Payer: Self-pay | Source: Ambulatory Visit | Attending: Pain Medicine | Admitting: Pain Medicine

## 2022-09-01 ENCOUNTER — Encounter: Payer: Self-pay | Admitting: Pain Medicine

## 2022-09-01 VITALS — BP 125/97 | HR 105 | Temp 97.2°F | Resp 18 | Ht 72.0 in | Wt 180.0 lb

## 2022-09-01 DIAGNOSIS — S9401XS Injury of lateral plantar nerve, right leg, sequela: Secondary | ICD-10-CM

## 2022-09-01 DIAGNOSIS — M25671 Stiffness of right ankle, not elsewhere classified: Secondary | ICD-10-CM

## 2022-09-01 DIAGNOSIS — S81801S Unspecified open wound, right lower leg, sequela: Secondary | ICD-10-CM

## 2022-09-01 DIAGNOSIS — Z09 Encounter for follow-up examination after completed treatment for conditions other than malignant neoplasm: Secondary | ICD-10-CM

## 2022-09-01 DIAGNOSIS — G8929 Other chronic pain: Secondary | ICD-10-CM

## 2022-09-01 DIAGNOSIS — G5781 Other specified mononeuropathies of right lower limb: Secondary | ICD-10-CM

## 2022-09-01 DIAGNOSIS — M25571 Pain in right ankle and joints of right foot: Secondary | ICD-10-CM

## 2022-09-01 DIAGNOSIS — S92121S Displaced fracture of body of right talus, sequela: Secondary | ICD-10-CM

## 2022-09-01 DIAGNOSIS — M19271 Secondary osteoarthritis, right ankle and foot: Secondary | ICD-10-CM

## 2022-09-01 NOTE — Progress Notes (Signed)
Safety precautions to be maintained throughout the outpatient stay will include: orient to surroundings, keep bed in low position, maintain call bell within reach at all times, provide assistance with transfer out of bed and ambulation.  

## 2022-09-03 ENCOUNTER — Ambulatory Visit: Payer: Self-pay | Admitting: Student in an Organized Health Care Education/Training Program

## 2022-09-17 NOTE — H&P (View-Only) (Signed)
Referring Physician:  Delano Metz, MD [address] [facility] [address] [place],  [place] [address]  Primary Physician:  Jerl Mina, MD  History of Present Illness: 09/18/2022 Gary Savage is here today with a chief complaint of right leg pain.  He suffered this after an injury to his right ankle that required surgery where he had a right posterior tibial nerve injury.  He developed complex regional pain syndrome due to this.  He had a successful spinal cord stimulator evaluation recently where he had 75% improvement of his pain.  He has been cleared by psychology.  Past Surgery: denies   The symptoms are causing a significant impact on the patient's life.   I have utilized the care everywhere function in epic to review the outside records available from external health systems.  Review of Systems:  A 10 point review of systems is negative, except for the pertinent positives and negatives detailed in the HPI.  Past Medical History: No past medical history on file.  Past Surgical History: Past Surgical History:  Procedure Laterality Date   KNEE ARTHROSCOPY Left     Allergies: Allergies as of 09/18/2022 - Review Complete 09/18/2022  Allergen Reaction Noted   Hydrocodone Itching 03/08/2020   Penicillins Hives 11/12/2020    Medications:  Current Outpatient Medications:    amitriptyline (ELAVIL) 10 MG tablet, Take 10 mg by mouth at bedtime., Disp: , Rfl:    gabapentin (NEURONTIN) 400 MG capsule, gabapentin 400 mg capsule, Disp: , Rfl:   Social History: Social History   Tobacco Use   Smoking status: Some Days    Packs/day: .5    Types: Cigarettes, E-cigarettes    Last attempt to quit: 2018    Years since quitting: 6.4   Smokeless tobacco: Never  Substance Use Topics   Alcohol use: Yes    Alcohol/week: 0.0 standard drinks of alcohol    Comment: occasional   Drug use: No    Family Medical History: Family History  Problem Relation Age of Onset    Healthy Mother     Physical Examination: Vitals:   09/18/22 1352  BP: 126/78    General: Patient is in no apparent distress. Attention to examination is appropriate.  Neck:   Supple.  Full range of motion.  Respiratory: Patient is breathing without any difficulty.   NEUROLOGICAL:     Awake, alert, oriented to person, place, and time.  Speech is clear and fluent.   Cranial Nerves: Pupils equal round and reactive to light.  Facial tone is symmetric.  Facial sensation is symmetric. Shoulder shrug is symmetric. Tongue protrusion is midline.  There is no pronator drift.  Strength: Side Biceps Triceps Deltoid Interossei Grip Wrist Ext. Wrist Flex.  R 5 5 5 5 5 5 5   L 5 5 5 5 5 5 5    Side Iliopsoas Quads Hamstring PF DF EHL  R 5 5 5 5  4- 4-  L 5 5 5 5 5 5    Reflexes are 1+ and symmetric at the biceps, triceps, brachioradialis, patella and achilles.   Hoffman's is absent.   Bilateral upper and lower extremity sensation is intact to light touch.    No evidence of dysmetria noted.  Gait is normal.     Medical Decision Making  Imaging: None to review except xray showing lead at T10  I have personally reviewed the images and agree with the above interpretation.  Assessment and Plan: Gary Savage is a pleasant 47 y.o. 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  He had a successful spinal cord stimulator evaluation.  I think he is an excellent candidate for spinal cord stimulator placement.  To evaluate the safety of his thoracic spine, I have recommended a thoracic spine MRI scan.  I suspect that we will be safe for placement anatomically.  We will then proceed for placement of a Medtronic spinal cord stimulator using the thoracic laminectomy for placement of an open paddle lead.  I discussed the planned procedure at length with the patient, including the risks, benefits, alternatives, and indications. The risks discussed  include but are not limited to bleeding, infection, need for reoperation, spinal fluid leak, stroke, vision loss, anesthetic complication, coma, paralysis, and even death. I also described in detail that improvement was not guaranteed.  The patient expressed understanding of these risks, and asked that we proceed with surgery. I described the surgery in layman's terms, and gave ample opportunity for questions, which were answered to the best of my ability.     Thank you for involving me in the care of this patient.      Gary Mccubbins K. Myer Haff MD, [facility]

## 2022-09-17 NOTE — Progress Notes (Unsigned)
Referring Physician:  Delano Metz, MD [address] [facility] [address] [place],  [place] [address]  Primary Physician:  Jerl Mina, MD  History of Present Illness: 09/18/2022 Gary Savage is here today with a chief complaint of right leg pain.  He suffered this after an injury to his right ankle that required surgery where he had a right posterior tibial nerve injury.  He developed complex regional pain syndrome due to this.  He had a successful spinal cord stimulator evaluation recently where he had 75% improvement of his pain.  He has been cleared by psychology.  Past Surgery: denies   The symptoms are causing a significant impact on the patient's life.   I have utilized the care everywhere function in epic to review the outside records available from external health systems.  Review of Systems:  A 10 point review of systems is negative, except for the pertinent positives and negatives detailed in the HPI.  Past Medical History: No past medical history on file.  Past Surgical History: Past Surgical History:  Procedure Laterality Date   KNEE ARTHROSCOPY Left     Allergies: Allergies as of 09/18/2022 - Review Complete 09/18/2022  Allergen Reaction Noted   Hydrocodone Itching 03/08/2020   Penicillins Hives 11/12/2020    Medications:  Current Outpatient Medications:    amitriptyline (ELAVIL) 10 MG tablet, Take 10 mg by mouth at bedtime., Disp: , Rfl:    gabapentin (NEURONTIN) 400 MG capsule, gabapentin 400 mg capsule, Disp: , Rfl:   Social History: Social History   Tobacco Use   Smoking status: Some Days    Packs/day: .5    Types: Cigarettes, E-cigarettes    Last attempt to quit: 2018    Years since quitting: 6.4   Smokeless tobacco: Never  Substance Use Topics   Alcohol use: Yes    Alcohol/week: 0.0 standard drinks of alcohol    Comment: occasional   Drug use: No    Family Medical History: Family History  Problem Relation Age of Onset    Healthy Mother     Physical Examination: Vitals:   09/18/22 1352  BP: 126/78    General: Patient is in no apparent distress. Attention to examination is appropriate.  Neck:   Supple.  Full range of motion.  Respiratory: Patient is breathing without any difficulty.   NEUROLOGICAL:     Awake, alert, oriented to person, place, and time.  Speech is clear and fluent.   Cranial Nerves: Pupils equal round and reactive to light.  Facial tone is symmetric.  Facial sensation is symmetric. Shoulder shrug is symmetric. Tongue protrusion is midline.  There is no pronator drift.  Strength: Side Biceps Triceps Deltoid Interossei Grip Wrist Ext. Wrist Flex.  R 5 5 5 5 5 5 5   L 5 5 5 5 5 5 5    Side Iliopsoas Quads Hamstring PF DF EHL  R 5 5 5 5  4- 4-  L 5 5 5 5 5 5    Reflexes are 1+ and symmetric at the biceps, triceps, brachioradialis, patella and achilles.   Hoffman's is absent.   Bilateral upper and lower extremity sensation is intact to light touch.    No evidence of dysmetria noted.  Gait is normal.     Medical Decision Making  Imaging: None to review except xray showing lead at T10  I have personally reviewed the images and agree with the above interpretation.  Assessment and Plan: Mr. Holte is a pleasant 47 y.o. 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  He had a successful spinal cord stimulator evaluation.  I think he is an excellent candidate for spinal cord stimulator placement.  To evaluate the safety of his thoracic spine, I have recommended a thoracic spine MRI scan.  I suspect that we will be safe for placement anatomically.  We will then proceed for placement of a Medtronic spinal cord stimulator using the thoracic laminectomy for placement of an open paddle lead.  I discussed the planned procedure at length with the patient, including the risks, benefits, alternatives, and indications. The risks discussed  include but are not limited to bleeding, infection, need for reoperation, spinal fluid leak, stroke, vision loss, anesthetic complication, coma, paralysis, and even death. I also described in detail that improvement was not guaranteed.  The patient expressed understanding of these risks, and asked that we proceed with surgery. I described the surgery in layman's terms, and gave ample opportunity for questions, which were answered to the best of my ability.     Thank you for involving me in the care of this patient.      Kazmir Oki K. Myer Haff MD, [facility]surgery

## 2022-09-18 ENCOUNTER — Encounter: Payer: Self-pay | Admitting: Neurosurgery

## 2022-09-18 ENCOUNTER — Ambulatory Visit (INDEPENDENT_AMBULATORY_CARE_PROVIDER_SITE_OTHER): Payer: PRIVATE HEALTH INSURANCE | Admitting: Neurosurgery

## 2022-09-18 VITALS — BP 126/78 | Ht 72.0 in | Wt 187.6 lb

## 2022-09-18 DIAGNOSIS — G894 Chronic pain syndrome: Secondary | ICD-10-CM

## 2022-09-18 DIAGNOSIS — G5771 Causalgia of right lower limb: Secondary | ICD-10-CM

## 2022-09-18 DIAGNOSIS — S81801S Unspecified open wound, right lower leg, sequela: Secondary | ICD-10-CM

## 2022-09-18 DIAGNOSIS — S8401XS Injury of tibial nerve at lower leg level, right leg, sequela: Secondary | ICD-10-CM

## 2022-09-18 NOTE — Patient Instructions (Signed)
Please see below for information in regards to your upcoming surgery:   Planned surgery: thoracic laminectomy for spinal cord stimulator placement (Medtronic)   Surgery date: 10/10/22 - you will find out your arrival time the business day before your surgery.   Pre-op appointment at [facility] Pre-admit Testing: we will call you with a date/time for this. Pre-admit testing is located on the first floor of the [facility] building, [address] [facility] [address]. Please bring all prescriptions in the original prescription bottles to your appointment, even if you have reviewed medications by phone with a pharmacy representative. Pre-op labs may be done at your pre-op appointment. You are not required to fast for these labs. Should you need to change your pre-op appointment, please call Pre-admit testing at [phone].     If you have FMLA/disability paperwork, please drop it off or fax it to [phone], attention Patty.   How to contact us:  If you have any questions/concerns before or after surgery, you can reach [place] at [phone], or you can send a mychart message. We can be reached by phone or mychart 8am-4pm, Monday-Friday.  *Please note: Calls after 4pm are forwarded to a third party answering service. Mychart messages are not routinely monitored during evenings, weekends, and holidays. Please call our office to contact the answering service for urgent concerns during non-business hours.     Appointments/FMLA & disability paperwork: Patty & Cristin  Nurse: Royston Cowper  Medical assistants: Laurann Montana Physician Assistant's: Manning Charity & Drake Leach Surgeon: Venetia Night, MD

## 2022-09-19 ENCOUNTER — Other Ambulatory Visit: Payer: Self-pay

## 2022-09-19 DIAGNOSIS — Z01818 Encounter for other preprocedural examination: Secondary | ICD-10-CM

## 2022-09-26 ENCOUNTER — Other Ambulatory Visit: Payer: Self-pay

## 2022-09-30 ENCOUNTER — Encounter
Admission: RE | Admit: 2022-09-30 | Discharge: 2022-09-30 | Disposition: A | Discharge status: Home / Self Care | Payer: PRIVATE HEALTH INSURANCE | Source: Ambulatory Visit | Attending: Neurosurgery | Admitting: Neurosurgery

## 2022-09-30 DIAGNOSIS — Z01812 Encounter for preprocedural laboratory examination: Secondary | ICD-10-CM | POA: Diagnosis not present

## 2022-09-30 HISTORY — DX: Pain in right ankle and joints of right foot: M25.571

## 2022-09-30 HISTORY — DX: Unspecified open wound, right lower leg, sequela: S81.801S

## 2022-09-30 HISTORY — DX: Personal history of urinary calculi: Z87.442

## 2022-09-30 LAB — BASIC METABOLIC PANEL
Anion gap: 9 (ref 5–15)
BUN: 8 mg/dL (ref 6–20)
CO2: 24 mmol/L (ref 22–32)
Calcium: 9 mg/dL (ref 8.9–10.3)
Chloride: 103 mmol/L (ref 98–111)
Creatinine, Ser: 1.03 mg/dL (ref 0.61–1.24)
GFR, Estimated: >60 mL/min (ref >60)
Glucose, Bld: 84 mg/dL (ref 70–99)
Potassium: 3.4 mmol/L — ABNORMAL LOW (ref 3.5–5.1)
Sodium: 136 mmol/L (ref 135–145)

## 2022-09-30 LAB — CBC
HCT: 44.8 % (ref 39.0–52.0)
Hemoglobin: 14.7 g/dL (ref 13.0–17.0)
MCH: 30.2 pg (ref 26.0–34.0)
MCHC: 32.8 g/dL (ref 30.0–36.0)
MCV: 92.2 fL (ref 80.0–100.0)
Platelets: 366 10*3/uL (ref 150–400)
RBC: 4.86 MIL/uL (ref 4.22–5.81)
RDW: 12.7 % (ref 11.5–15.5)
WBC: 6.9 10*3/uL (ref 4.0–10.5)
nRBC: 0 % (ref 0.0–0.2)

## 2022-09-30 LAB — TYPE AND SCREEN
ABO/RH(D): A POS
Antibody Screen: NEGATIVE

## 2022-09-30 LAB — SURGICAL PCR SCREEN
MRSA, PCR: NEGATIVE
Staphylococcus aureus: NEGATIVE

## 2022-09-30 NOTE — Patient Instructions (Addendum)
Your procedure is scheduled on: Friday, June 21 Report to the Registration Desk on the 1st floor of the [facility] To find out your arrival time, please call [phone] between 1PM - 3PM on: Thursday, June 20 If your arrival time is 6:00 am, do not arrive before that time as the [facility] entrance doors do not open until 6:00 am.  REMEMBER: Instructions that are not followed completely may result in serious medical risk, up to and including death; or upon the discretion of your surgeon and anesthesiologist your surgery may need to be rescheduled.  Do not eat food after midnight the night before surgery.  No gum chewing or hard candies.  You may however, drink CLEAR liquids up to 2 hours before you are scheduled to arrive for your surgery. Do not drink anything within 2 hours of your scheduled arrival time.  Clear liquids include: - water  - apple juice without pulp - gatorade (not RED colors) - black coffee or tea (Do NOT add milk or creamers to the coffee or tea) Do NOT drink anything that is not on this list.  One week prior to surgery: starting June 14 Stop Anti-inflammatories (NSAIDS) such as Advil, Aleve, Ibuprofen, Motrin, Naproxen, Naprosyn and Aspirin based products such as Excedrin, Goody's Powder, BC Powder. Stop ANY OVER THE COUNTER supplements until after surgery. You may however, continue to take Tylenol if needed for pain up until the day of surgery.  Continue taking all prescribed medications   TAKE ONLY THESE MEDICATIONS THE MORNING OF SURGERY WITH A SIP OF WATER:  Gabapentin  No Alcohol for 24 hours before or after surgery.  No Smoking including e-cigarettes for 24 hours before surgery.  No chewable tobacco products for at least 6 hours before surgery.  No nicotine patches on the day of surgery.  Do not use any "recreational" drugs for at least a week (preferably 2 weeks) before your surgery.  Please be advised that the combination of cocaine and  anesthesia may have negative outcomes, up to and including death. If you test positive for cocaine, your surgery will be cancelled.  On the morning of surgery brush your teeth with toothpaste and water, you may rinse your mouth with mouthwash if you wish. Do not swallow any toothpaste or mouthwash.  Use CHG Soap as directed on instruction sheet.  Do not wear jewelry, make-up, hairpins, clips or nail polish.  Do not wear lotions, powders, or perfumes.   Do not shave body hair from the neck down 48 hours before surgery.  Contact lenses, hearing aids and dentures may not be worn into surgery.  Do not bring valuables to the hospital. [facility] is not responsible for any missing/lost belongings or valuables.   Notify your doctor if there is any change in your medical condition (cold, fever, infection).  Wear comfortable clothing (specific to your surgery type) to the hospital.  After surgery, you can help prevent lung complications by doing breathing exercises.  Take deep breaths and cough every 1-2 hours. Your doctor may order a device called an Incentive Spirometer to help you take deep breaths.  If you are being discharged the day of surgery, you will not be allowed to drive home. You will need a responsible individual to drive you home and stay with you for 24 hours after surgery.   If you are taking public transportation, you will need to have a responsible individual with you.  Please call the Pre-admissions Testing Dept. at [phone]  if you have any questions about these instructions.  Surgery Visitation Policy:  Patients having surgery or a procedure may have two visitors.  Children under the age of 52 must have an adult with them who is not the patient.     Preparing for Surgery with CHLORHEXIDINE GLUCONATE (CHG) Soap  Chlorhexidine Gluconate (CHG) Soap  o An antiseptic cleaner that kills germs and bonds with the skin to continue killing germs even after  washing  o Used for showering the night before surgery and morning of surgery  Before surgery, you can play an important role by reducing the number of germs on your skin.  CHG (Chlorhexidine gluconate) soap is an antiseptic cleanser which kills germs and bonds with the skin to continue killing germs even after washing.  Please do not use if you have an allergy to CHG or antibacterial soaps. If your skin becomes reddened/irritated stop using the CHG.  1. Shower the NIGHT BEFORE SURGERY and the MORNING OF SURGERY with CHG soap.  2. If you choose to wash your hair, wash your hair first as usual with your normal shampoo.  3. After shampooing, rinse your hair and body thoroughly to remove the shampoo.  4. Use CHG as you would any other liquid soap. You can apply CHG directly to the skin and wash gently with a scrungie or a clean washcloth.  5. Apply the CHG soap to your body only from the neck down. Do not use on open wounds or open sores. Avoid contact with your eyes, ears, mouth, and genitals (private parts). Wash face and genitals (private parts) with your normal soap.  6. Wash thoroughly, paying special attention to the area where your surgery will be performed.  7. Thoroughly rinse your body with warm water.  8. Do not shower/wash with your normal soap after using and rinsing off the CHG soap.  9. Pat yourself dry with a clean towel.  10. Wear clean pajamas to bed the night before surgery.  12. Place clean sheets on your bed the night of your first shower and do not sleep with pets.  13. Shower again with the CHG soap on the day of surgery prior to arriving at the hospital.  14. Do not apply any deodorants/lotions/powders.  15. Please wear clean clothes to the hospital.

## 2022-10-02 ENCOUNTER — Telehealth: Payer: Self-pay

## 2022-10-02 ENCOUNTER — Other Ambulatory Visit: Payer: Self-pay

## 2022-10-02 ENCOUNTER — Inpatient Hospital Stay
Admission: RE | Admit: 2022-10-02 | Discharge: 2022-10-02 | Disposition: A | Discharge status: Home / Self Care | Payer: Self-pay | Source: Ambulatory Visit | Attending: Neurosurgery | Admitting: Neurosurgery

## 2022-10-02 DIAGNOSIS — Z049 Encounter for examination and observation for unspecified reason: Secondary | ICD-10-CM

## 2022-10-02 NOTE — Telephone Encounter (Signed)
MRI thoracic spine completed 10/02/22 at [facility]. Received CD and loaded to [facility].   Please let me know when we receive the MRI report so I can notify Dr Jeannie Fend. We need this before his surgery on 10/10/22.   Thanks

## 2022-10-03 NOTE — Telephone Encounter (Signed)
Message Received: Today Venetia Night, MD  Sharlot Gowda, RN Thanks.  No lesions that would preclude stimulator placement

## 2022-10-03 NOTE — Telephone Encounter (Signed)
Has been scanned.

## 2022-10-03 NOTE — Telephone Encounter (Signed)
Noted. Forwarded report to Dr Myer Haff.

## 2022-10-03 NOTE — Telephone Encounter (Signed)
Notified pt via mychart

## 2022-10-03 NOTE — Telephone Encounter (Signed)
FYI

## 2022-10-09 MED ORDER — CEFAZOLIN IN SODIUM CHLORIDE 2-0.9 GM/100ML-% IV SOLN
2.0000 g | Freq: Once | INTRAVENOUS | Status: DC
  Filled 2022-10-09: qty 100

## 2022-10-09 MED ORDER — CHLORHEXIDINE GLUCONATE 0.12 % MT SOLN
15.0000 mL | Freq: Once | OROMUCOSAL | Status: AC
  Administered 2022-10-10: 15 mL via OROMUCOSAL

## 2022-10-09 MED ORDER — FAMOTIDINE 20 MG PO TABS
20.0000 mg | ORAL_TABLET | Freq: Once | ORAL | Status: AC
  Administered 2022-10-10: 20 mg via ORAL

## 2022-10-09 MED ORDER — VANCOMYCIN HCL IN DEXTROSE 1-5 GM/200ML-% IV SOLN
1000.0000 mg | Freq: Once | INTRAVENOUS | Status: AC
  Administered 2022-10-10: 1000 mg via INTRAVENOUS

## 2022-10-09 MED ORDER — LACTATED RINGERS IV SOLN: INTRAVENOUS | Status: DC

## 2022-10-09 MED ORDER — ORAL CARE MOUTH RINSE: 15.0000 mL | Freq: Once | OROMUCOSAL | Status: AC

## 2022-10-09 MED ORDER — CEFAZOLIN SODIUM-DEXTROSE 2-4 GM/100ML-% IV SOLN: 2.0000 g | INTRAVENOUS | Status: DC

## 2022-10-10 ENCOUNTER — Encounter
Admission: RE | Disposition: A | Discharge status: Home / Self Care | Payer: Self-pay | Source: Home / Self Care | Attending: Neurosurgery

## 2022-10-10 ENCOUNTER — Other Ambulatory Visit: Payer: Self-pay

## 2022-10-10 ENCOUNTER — Ambulatory Visit: Payer: PRIVATE HEALTH INSURANCE | Admitting: Certified Registered"

## 2022-10-10 ENCOUNTER — Ambulatory Visit: Payer: PRIVATE HEALTH INSURANCE | Admitting: Urgent Care

## 2022-10-10 ENCOUNTER — Encounter: Payer: Self-pay | Admitting: Neurosurgery

## 2022-10-10 ENCOUNTER — Ambulatory Visit
Admission: RE | Admit: 2022-10-10 | Discharge: 2022-10-10 | Disposition: A | Discharge status: Home / Self Care | Payer: PRIVATE HEALTH INSURANCE | Attending: Neurosurgery | Admitting: Neurosurgery

## 2022-10-10 ENCOUNTER — Ambulatory Visit: Payer: Self-pay

## 2022-10-10 DIAGNOSIS — G894 Chronic pain syndrome: Secondary | ICD-10-CM | POA: Insufficient documentation

## 2022-10-10 DIAGNOSIS — G5771 Causalgia of right lower limb: Secondary | ICD-10-CM | POA: Diagnosis not present

## 2022-10-10 DIAGNOSIS — S81801S Unspecified open wound, right lower leg, sequela: Secondary | ICD-10-CM | POA: Insufficient documentation

## 2022-10-10 DIAGNOSIS — X58XXXS Exposure to other specified factors, sequela: Secondary | ICD-10-CM | POA: Insufficient documentation

## 2022-10-10 DIAGNOSIS — Z01812 Encounter for preprocedural laboratory examination: Secondary | ICD-10-CM

## 2022-10-10 DIAGNOSIS — S8401XS Injury of tibial nerve at lower leg level, right leg, sequela: Secondary | ICD-10-CM | POA: Diagnosis not present

## 2022-10-10 DIAGNOSIS — Z01818 Encounter for other preprocedural examination: Secondary | ICD-10-CM

## 2022-10-10 HISTORY — PX: THORACIC LAMINECTOMY FOR SPINAL CORD STIMULATOR: SHX6887

## 2022-10-10 LAB — ABO/RH: ABO/RH(D): A POS

## 2022-10-10 SURGERY — THORACIC LAMINECTOMY FOR SPINAL CORD STIMULATOR
Anesthesia: General | Site: Spine Thoracic

## 2022-10-10 MED ORDER — ACETAMINOPHEN 10 MG/ML IV SOLN: 1000.0000 mg | Freq: Once | INTRAVENOUS | Status: DC | PRN

## 2022-10-10 MED ORDER — OXYCODONE HCL 5 MG PO TABS: 5.0000 mg | ORAL_TABLET | ORAL | 0 refills | Status: DC | PRN

## 2022-10-10 MED ORDER — 0.9 % SODIUM CHLORIDE (POUR BTL) OPTIME
TOPICAL | Status: DC | PRN
  Administered 2022-10-10: 500 mL

## 2022-10-10 MED ORDER — ACETAMINOPHEN 10 MG/ML IV SOLN
INTRAVENOUS | Status: AC
  Filled 2022-10-10: qty 100

## 2022-10-10 MED ORDER — SODIUM CHLORIDE (PF) 0.9 % IJ SOLN
INTRAMUSCULAR | Status: DC | PRN
  Administered 2022-10-10: 60 mL via INTRAMUSCULAR

## 2022-10-10 MED ORDER — OXYCODONE HCL 5 MG/5ML PO SOLN: 5.0000 mg | Freq: Once | ORAL | Status: DC | PRN

## 2022-10-10 MED ORDER — REMIFENTANIL HCL 1 MG IV SOLR
INTRAVENOUS | Status: AC
  Filled 2022-10-10: qty 1000

## 2022-10-10 MED ORDER — DROPERIDOL 2.5 MG/ML IJ SOLN: 0.6250 mg | Freq: Once | INTRAMUSCULAR | Status: DC | PRN

## 2022-10-10 MED ORDER — PROPOFOL 500 MG/50ML IV EMUL
INTRAVENOUS | Status: DC | PRN
  Administered 2022-10-10: 150 ug/kg/min via INTRAVENOUS

## 2022-10-10 MED ORDER — PHENYLEPHRINE HCL-NACL 20-0.9 MG/250ML-% IV SOLN
INTRAVENOUS | Status: DC | PRN
  Administered 2022-10-10: 25 ug/min via INTRAVENOUS

## 2022-10-10 MED ORDER — BUPIVACAINE HCL (PF) 0.5 % IJ SOLN
INTRAMUSCULAR | Status: AC
  Filled 2022-10-10: qty 30

## 2022-10-10 MED ORDER — ACETAMINOPHEN 10 MG/ML IV SOLN
INTRAVENOUS | Status: DC | PRN
  Administered 2022-10-10: 1000 mg via INTRAVENOUS

## 2022-10-10 MED ORDER — DEXAMETHASONE SODIUM PHOSPHATE 10 MG/ML IJ SOLN
INTRAMUSCULAR | Status: DC | PRN
  Administered 2022-10-10: 10 mg via INTRAVENOUS

## 2022-10-10 MED ORDER — BUPIVACAINE LIPOSOME 1.3 % IJ SUSP
INTRAMUSCULAR | Status: AC
  Filled 2022-10-10: qty 20

## 2022-10-10 MED ORDER — LIDOCAINE HCL (CARDIAC) PF 100 MG/5ML IV SOSY
PREFILLED_SYRINGE | INTRAVENOUS | Status: DC | PRN
  Administered 2022-10-10: 100 mg via INTRAVENOUS

## 2022-10-10 MED ORDER — MIDAZOLAM HCL 2 MG/2ML IJ SOLN
INTRAMUSCULAR | Status: DC | PRN
  Administered 2022-10-10: 2 mg via INTRAVENOUS

## 2022-10-10 MED ORDER — PHENYLEPHRINE HCL (PRESSORS) 10 MG/ML IV SOLN
INTRAVENOUS | Status: DC | PRN
  Administered 2022-10-10 (×2): 160 ug via INTRAVENOUS
  Administered 2022-10-10 (×2): 80 ug via INTRAVENOUS
  Administered 2022-10-10: 160 ug via INTRAVENOUS
  Administered 2022-10-10: 80 ug via INTRAVENOUS

## 2022-10-10 MED ORDER — BUPIVACAINE-EPINEPHRINE (PF) 0.5% -1:200000 IJ SOLN
INTRAMUSCULAR | Status: DC | PRN
  Administered 2022-10-10: 10 mL via PERINEURAL

## 2022-10-10 MED ORDER — PROPOFOL 1000 MG/100ML IV EMUL
INTRAVENOUS | Status: AC
  Filled 2022-10-10: qty 100

## 2022-10-10 MED ORDER — VANCOMYCIN HCL IN DEXTROSE 1-5 GM/200ML-% IV SOLN
INTRAVENOUS | Status: AC
  Filled 2022-10-10: qty 200

## 2022-10-10 MED ORDER — SENNA 8.6 MG PO TABS: 1.0000 | ORAL_TABLET | Freq: Every day | ORAL | 0 refills | Status: DC | PRN

## 2022-10-10 MED ORDER — SUCCINYLCHOLINE CHLORIDE 200 MG/10ML IV SOSY
PREFILLED_SYRINGE | INTRAVENOUS | Status: DC | PRN
  Administered 2022-10-10: 120 mg via INTRAVENOUS

## 2022-10-10 MED ORDER — FENTANYL CITRATE (PF) 100 MCG/2ML IJ SOLN: 25.0000 ug | INTRAMUSCULAR | Status: DC | PRN

## 2022-10-10 MED ORDER — PROPOFOL 10 MG/ML IV BOLUS
INTRAVENOUS | Status: DC | PRN
  Administered 2022-10-10: 180 mg via INTRAVENOUS

## 2022-10-10 MED ORDER — LACTATED RINGERS IV SOLN: INTRAVENOUS | Status: DC | PRN

## 2022-10-10 MED ORDER — CHLORHEXIDINE GLUCONATE 0.12 % MT SOLN
OROMUCOSAL | Status: AC
  Filled 2022-10-10: qty 15

## 2022-10-10 MED ORDER — METHOCARBAMOL 500 MG PO TABS: 500.0000 mg | ORAL_TABLET | Freq: Four times a day (QID) | ORAL | 0 refills | Status: DC

## 2022-10-10 MED ORDER — DEXAMETHASONE SODIUM PHOSPHATE 10 MG/ML IJ SOLN
INTRAMUSCULAR | Status: AC
  Filled 2022-10-10: qty 1

## 2022-10-10 MED ORDER — FAMOTIDINE 20 MG PO TABS
ORAL_TABLET | ORAL | Status: AC
  Filled 2022-10-10: qty 1

## 2022-10-10 MED ORDER — PROMETHAZINE HCL 25 MG/ML IJ SOLN: 6.2500 mg | INTRAMUSCULAR | Status: DC | PRN

## 2022-10-10 MED ORDER — FENTANYL CITRATE (PF) 100 MCG/2ML IJ SOLN
INTRAMUSCULAR | Status: DC | PRN
  Administered 2022-10-10: 100 ug via INTRAVENOUS

## 2022-10-10 MED ORDER — SURGIFLO WITH THROMBIN (HEMOSTATIC MATRIX KIT) OPTIME
TOPICAL | Status: DC | PRN
  Administered 2022-10-10: 1 via TOPICAL

## 2022-10-10 MED ORDER — ONDANSETRON HCL 4 MG/2ML IJ SOLN
INTRAMUSCULAR | Status: DC | PRN
  Administered 2022-10-10: 4 mg via INTRAVENOUS

## 2022-10-10 MED ORDER — IRRISEPT - 450ML BOTTLE WITH 0.05% CHG IN STERILE WATER, USP 99.95% OPTIME
TOPICAL | Status: DC | PRN
  Administered 2022-10-10: 450 mL

## 2022-10-10 MED ORDER — FLUMAZENIL 0.5 MG/5ML IV SOLN
INTRAVENOUS | Status: DC | PRN
  Administered 2022-10-10: .2 mg via INTRAVENOUS

## 2022-10-10 MED ORDER — REMIFENTANIL HCL 1 MG IV SOLR
INTRAVENOUS | Status: DC | PRN
  Administered 2022-10-10: .2 ug/kg/min via INTRAVENOUS

## 2022-10-10 MED ORDER — ONDANSETRON HCL 4 MG/2ML IJ SOLN
INTRAMUSCULAR | Status: AC
  Filled 2022-10-10: qty 2

## 2022-10-10 MED ORDER — MIDAZOLAM HCL 2 MG/2ML IJ SOLN
INTRAMUSCULAR | Status: AC
  Filled 2022-10-10: qty 2

## 2022-10-10 MED ORDER — BUPIVACAINE-EPINEPHRINE (PF) 0.5% -1:200000 IJ SOLN
INTRAMUSCULAR | Status: AC
  Filled 2022-10-10: qty 10

## 2022-10-10 MED ORDER — PHENYLEPHRINE HCL-NACL 20-0.9 MG/250ML-% IV SOLN
INTRAVENOUS | Status: AC
  Filled 2022-10-10: qty 250

## 2022-10-10 MED ORDER — FENTANYL CITRATE (PF) 100 MCG/2ML IJ SOLN
INTRAMUSCULAR | Status: AC
  Filled 2022-10-10: qty 2

## 2022-10-10 MED ORDER — OXYCODONE HCL 5 MG PO TABS: 5.0000 mg | ORAL_TABLET | Freq: Once | ORAL | Status: DC | PRN

## 2022-10-10 SURGICAL SUPPLY — 51 items
ADH SKN CLS APL DERMABOND .7 (GAUZE/BANDAGES/DRESSINGS) ×2
AGENT HMST KT MTR STRL THRMB (HEMOSTASIS) ×1
BUR NEURO DRILL SOFT 3.0X3.8M (BURR) ×1 IMPLANT
CONTROLLER INTELLIS PTM PAPER (NEUROSURGERY SUPPLIES) IMPLANT
DERMABOND ADVANCED .7 DNX12 (GAUZE/BANDAGES/DRESSINGS) ×2 IMPLANT
DEVICE IMPLANT NEUROSTIMULATOR (Neuro Prosthesis/Implant) IMPLANT
DRAPE C ARM PK CFD 31 SPINE (DRAPES) ×1 IMPLANT
DRAPE LAPAROTOMY 100X77 ABD (DRAPES) ×1 IMPLANT
DRSG OPSITE POSTOP 4X6 (GAUZE/BANDAGES/DRESSINGS) IMPLANT
DRSG OPSITE POSTOP 4X8 (GAUZE/BANDAGES/DRESSINGS) IMPLANT
ELECT REM PT RETURN 9FT ADLT (ELECTROSURGICAL) ×1
ELECTRODE REM PT RTRN 9FT ADLT (ELECTROSURGICAL) ×1 IMPLANT
FEE INTRAOP CADWELL SUPPLY NCS (MISCELLANEOUS) IMPLANT
FEE INTRAOP MONITOR IMPULS NCS (MISCELLANEOUS) IMPLANT
GLOVE BIOGEL PI IND STRL 6.5 (GLOVE) ×1 IMPLANT
GLOVE SURG SYN 6.5 ES PF (GLOVE) ×1 IMPLANT
GLOVE SURG SYN 6.5 PF PI (GLOVE) ×1 IMPLANT
GLOVE SURG SYN 8.5 E (GLOVE) ×3 IMPLANT
GLOVE SURG SYN 8.5 PF PI (GLOVE) ×3 IMPLANT
GOWN SRG LRG LVL 4 IMPRV REINF (GOWNS) ×1 IMPLANT
GOWN SRG XL LVL 3 NONREINFORCE (GOWNS) ×1 IMPLANT
GOWN STRL NON-REIN TWL XL LVL3 (GOWNS) ×1
GOWN STRL REIN LRG LVL4 (GOWNS) ×1
GRAFT DURAGEN MATRIX 1WX1L (Tissue) IMPLANT
INTRAOP CADWELL SUPPLY FEE NCS (MISCELLANEOUS) ×1
INTRAOP DISP SUPPLY FEE NCS (MISCELLANEOUS) ×1
INTRAOP MONITOR FEE IMPULS NCS (MISCELLANEOUS) ×1
JET LAVAGE IRRISEPT WOUND (IRRIGATION / IRRIGATOR) ×1
KIT SPINAL PRONEVIEW (KITS) ×1 IMPLANT
LAVAGE JET IRRISEPT WOUND (IRRIGATION / IRRIGATOR) IMPLANT
MANIFOLD NEPTUNE II (INSTRUMENTS) ×1 IMPLANT
MARKER SKIN DUAL TIP RULER LAB (MISCELLANEOUS) ×1 IMPLANT
NDL SAFETY ECLIP 18X1.5 (MISCELLANEOUS) ×1 IMPLANT
NS IRRIG 1000ML POUR BTL (IV SOLUTION) ×1 IMPLANT
PACK LAMINECTOMY ARMC (PACKS) ×1 IMPLANT
RECHARGER INTELLIS (NEUROSURGERY SUPPLIES) IMPLANT
SOLUTION IRRIG SURGIPHOR (IV SOLUTION) ×1 IMPLANT
STAPLER SKIN PROX 35W (STAPLE) ×1 IMPLANT
STIMULATOR CORD SURESCAN MRI (Stimulator) IMPLANT
SURGIFLO W/THROMBIN 8M KIT (HEMOSTASIS) ×1 IMPLANT
SUT DVC VLOC 3-0 CL 6 P-12 (SUTURE) ×1 IMPLANT
SUT SILK 2 0SH CR/8 30 (SUTURE) ×1 IMPLANT
SUT VIC AB 0 CT1 18XCR BRD 8 (SUTURE) ×1 IMPLANT
SUT VIC AB 0 CT1 27 (SUTURE) ×2
SUT VIC AB 0 CT1 27XCR 8 STRN (SUTURE) IMPLANT
SUT VIC AB 0 CT1 8-18 (SUTURE)
SUT VIC AB 2-0 CT1 18 (SUTURE) ×1 IMPLANT
SUT VLOC 90 3-0 CLR P-12 (SUTURE) IMPLANT
SYR 10ML LL (SYRINGE) ×1 IMPLANT
SYR 30ML LL (SYRINGE) ×2 IMPLANT
TRAP FLUID SMOKE EVACUATOR (MISCELLANEOUS) ×1 IMPLANT

## 2022-10-10 NOTE — Anesthesia Postprocedure Evaluation (Signed)
Anesthesia Post Note  Patient: [name]  Procedure(s) Performed: THORACIC LAMINECTOMY FOR SPINAL CORD STIMULATOR PLACEMENT (MEDTRONIC) (Spine Thoracic)  Patient location during evaluation: PACU Anesthesia Type: General Level of consciousness: awake and alert Pain management: pain level controlled Vital Signs Assessment: post-procedure vital signs reviewed and stable Respiratory status: spontaneous breathing, nonlabored ventilation, respiratory function stable and patient connected to nasal cannula oxygen Cardiovascular status: blood pressure returned to baseline and stable Postop Assessment: no apparent nausea or vomiting Anesthetic complications: no   No notable events documented.   Last Vitals:  Vitals:   10/10/22 1403 10/10/22 1424  BP: (!) 135/99 (!) 125/94  Pulse: 68 71  Resp: 16   Temp: 36.6 C   SpO2: 95%     Last Pain:  Vitals:   10/10/22 1403  TempSrc: Temporal  PainSc: 3                  Gary Savage

## 2022-10-10 NOTE — Anesthesia Procedure Notes (Signed)
Procedure Name: Intubation Date/Time: 10/10/2022 11:05 AM  Performed by: Rodney Booze, CRNAPre-anesthesia Checklist: Patient identified, Emergency Drugs available, Suction available and Patient being monitored Patient Re-evaluated:Patient Re-evaluated prior to induction Oxygen Delivery Method: Circle system utilized Preoxygenation: Pre-oxygenation with 100% oxygen Induction Type: IV induction Ventilation: Mask ventilation without difficulty Laryngoscope Size: McGraph and 4 Grade View: Grade I Tube type: Oral Tube size: 7.5 mm Number of attempts: 1 Airway Equipment and Method: Stylet and Oral airway Placement Confirmation: ETT inserted through vocal cords under direct vision, positive ETCO2 and breath sounds checked- equal and bilateral Secured at: 21 cm Tube secured with: Tape Dental Injury: Teeth and Oropharynx as per pre-operative assessment

## 2022-10-10 NOTE — Discharge Instructions (Addendum)
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 Increase protein intake to promote wound healing.  Recommended activity: no lifting, driving, or strenuous exercise for 4 weeks . You should walk multiple times per day  Special Instructions  No straining, no heavy lifting > 10lbs x 4 weeks.  Keep incision area clean and dry. May shower in 2 days. No baths or pools for 6 weeks.  Please remove dressing tomorrow, no need to apply a bandage afterwards  You have no sutures to remove, the skin is closed with adhesive  Please take pain medications as directed. Take a stool softener if on pain medications   Please Report any of the following: Nausea or Vomiting, Temperature is greater than 101.24F (38.1C) degrees, Dizziness, Abdominal Pain, Difficulty Breathing or Shortness of Breath, Inability to Eat, drink Fluids, or Take medications, Bleeding, swelling, or drainage from surgical incision sites, New numbness or weakness, and Bowel or bladder dysfunction to the neurosurgeon on call. How to contact us:  If you have any questions/concerns before or after surgery, you can reach [place] at [phone], or you can send a mychart message. We can be reached by phone or mychart 8am-4pm, Monday-Friday.  *Please note: Calls after 4pm are forwarded to a third party answering service. Mychart messages are not routinely monitored during evenings, weekends, and holidays. Please call our office to contact the answering service for urgent concerns during non-business hours.   Additional Follow up appointments Please follow up with Drake Leach PA-C as scheduled in 2-3 weeks   Please see below for scheduled  appointments:  Future Appointments  Date Time Provider Department Center  10/22/2022  1:30 PM Drake Leach, PA-C [facility] None  11/18/2022  3:30 PM Venetia Night, MD [facility] None       Information for Discharge Teaching:    EXPAREL (bupivacaine liposome injectable suspension)   Your surgeon or anesthesiologist gave you EXPAREL(bupivacaine) to help control your pain after surgery.  EXPAREL is a local anesthetic that provides pain relief by numbing the tissue around the surgical site. EXPAREL is designed to release pain medication over time and can control pain for up to 72 hours. Depending on how you respond to EXPAREL, you may require less pain medication during your recovery.  Possible side effects: Temporary loss of sensation or ability to move in the area where bupivacaine was injected. Nausea, vomiting, constipation Rarely, numbness and tingling in your mouth or lips, lightheadedness, or anxiety may occur. Call your doctor right away if you think you may be experiencing any of these sensations, or if you have other questions regarding possible side effects.  Follow all other discharge instructions given to you by your surgeon or nurse. Eat a healthy diet and drink plenty of water or other fluids.  If you return to the hospital for any reason within 96 hours following the administration of EXPAREL, it is important for health care providers to know that you have received this anesthetic. A teal colored band has been placed on your arm with the date, time and amount of EXPAREL you have received in order to alert and inform your health care providers. Please leave this armband in place for the full 96 hours following administration, and then you  may remove the band.       AMBULATORY SURGERY  DISCHARGE INSTRUCTIONS  The drugs that you were given will stay in your system until tomorrow so for the next 24 hours you should not:  Drive an automobile Make any legal decisions Drink  any alcoholic beverage  You may resume regular meals tomorrow.  Today it is better to start with liquids and gradually work up to solid foods.  You may eat anything you prefer, but it is better to start with liquids, then soup and crackers, and gradually work up to solid foods.  Please notify your doctor immediately if you have any unusual bleeding, trouble breathing, redness and pain at the surgery site, drainage, fever, or pain not relieved by medication.  Additional Instructions:  A teal colored band has been placed on your arm with the date, time and amount of EXPAREL you have received in order to alert and inform your health care providers. Please leave this armband in place for the full 96 hours (4 days) following administration, and then you may remove the band.  Please contact your physician with any problems or Same Day Surgery at [phone], Monday through Friday 6 am to 4 pm, or  at [facility] number at [phone].

## 2022-10-10 NOTE — Interval H&P Note (Signed)
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  The various methods of treatment have been discussed with the patient and family. After consideration of risks, benefits and other options for treatment, the patient has consented to  Procedure(s): THORACIC LAMINECTOMY FOR SPINAL CORD STIMULATOR PLACEMENT (MEDTRONIC) (N/A) as a surgical intervention.  The patient's history has been reviewed, patient examined, no change in status, stable for surgery.  I have reviewed the patient's chart and labs.  Questions were answered to the patient's satisfaction.    Heart sounds normal no MRG. Chest Clear to Auscultation Bilaterally.   [name]

## 2022-10-10 NOTE — Transfer of Care (Signed)
Immediate Anesthesia Transfer of Care Note  Patient: Gary Savage  Procedure(s) Performed: THORACIC LAMINECTOMY FOR SPINAL CORD STIMULATOR PLACEMENT (MEDTRONIC) (Spine Thoracic)  Patient Location: PACU  Anesthesia Type:General  Level of Consciousness: drowsy  Airway & Oxygen Therapy: Patient Spontanous Breathing and Patient connected to face mask oxygen  Post-op Assessment: Report given to RN and Post -op Vital signs reviewed and stable  Post vital signs: stable  Last Vitals:  Vitals Value Taken Time  BP 112/88 10/10/22 1315  Temp    Pulse 74 10/10/22 1315  Resp 13 10/10/22 1315  SpO2 100 % 10/10/22 1315  Vitals shown include unvalidated device data.  Last Pain:  Vitals:   10/10/22 1014  TempSrc: Oral  PainSc: 3          Complications: No notable events documented.

## 2022-10-10 NOTE — Op Note (Signed)
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 The patient had a successful trial for spinal cord stimulation, so was consented for placement of a permanent device   Findings: successful placement of a Medtronic spinal cord stimulator.   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     Preoperative Note:    Risks of surgery discussed in clinic.   Operative Note:      The patient was then brought from the preoperative center with intravenous access established.  The patient underwent general anesthesia and endotracheal tube intubation, then was rotated on the [facility] table where all pressure points were appropriately padded.  An incision was marked with flouroscopy at T10/11, and on the right flank. The skin was then thoroughly cleansed.  Perioperative antibiotic prophylaxis was administered.  Sterile prep and drapes were then applied and a timeout was then observed.     Once this was complete an incision was opened with the use of a #10 blade knife in the midline at the thoracic incision.  The paraspinus muscled were subperiosteally dissected until the laminae of T10 and T11 were visualized. Flouroscopy was used to confirm the level. A self-retaining retractor was placed.   The rongeur was used to remove the spinous process of T11.  The drill was used to thin the bone until the ligamentum flavum was visualized.  The ligamentum was then removed and the dura visualized. This was widened  until placement of the paddle lead was possible.     The lead was then advanced to the T9/10 disc space at the top of the lead.  The lead was secured with a 2-0 silk suture.     The incision on the flank was then opened and a pocket formed until it was large enough for the pulse generator.  The tunneler was used to connect between the pocket and the incision.  The lead was inserted into the tunneler and tunneled to the buttock.  The leads were attached to the IPG and impedances checked.  The leads were then tightened.  The IPG was then inserted into the pouch.   Both sites were irrigated.  The wounds were closed in layers with 0 and 2-0 vicryl.  The skin was approximated with monocryl. A sterile dressing was applied.   Monitoring was stable throughout.   Patient was then rotated back to the preoperative bed awakened from anesthesia and taken to recovery. All counts are correct in this case.   I performed the entire procedure with the assistance of Manning Charity PA as an Designer, television/film set. An assistant was required for this procedure due to the complexity.  The assistant provided assistance in tissue manipulation and suction, and was required for the successful and safe performance of the procedure. I performed the critical portions of the procedure.      Venetia Night MD

## 2022-10-10 NOTE — Discharge Summary (Signed)
Discharge Summary  Patient ID: Gary Savage MRN: [id number] DOB/AGE: [birth year] 47 y.o.  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 *   Discharged Condition: good  Hospital Course:  Gary Savage is a 47 y.o presenting with CRPS s/p SCS placement. His intraoperative course was uncomplicated. He was monitored in PACU and discharged home after ambulating, urinating, and tolerating PO intake  Consults: None  Significant Diagnostic Studies: none  Treatments: surgery: as above. Please see separately dictated operative report for further details  Discharge Exam: Blood pressure (!) 129/100, pulse (!) 101, temperature 98.2 F (36.8 C), temperature source Oral, resp. rate 18, height 6' (1.829 m), weight 85 kg, SpO2 96 %. CN II-XII grossly intact MAEW Incisions c/d/i  Disposition: Discharge disposition: 01-Home or Self Care        Allergies as of 10/10/2022       Reactions   Penicillins Hives   Patient states he also has oral welling   Hydrocodone Itching        Medication List     TAKE these medications    amitriptyline 10 MG tablet Commonly known as: ELAVIL Take 10 mg by mouth at bedtime.   gabapentin 400 MG capsule Commonly known as: NEURONTIN Take 400 mg by mouth 3 (three) times daily.   methocarbamol 500 MG tablet Commonly known as: ROBAXIN Take 1 tablet (500 mg total) by mouth 4 (four) times daily.   oxyCODONE 5 MG immediate release tablet Commonly known as: Roxicodone Take 1 tablet (5 mg total) by mouth every 4 (four) hours as needed for severe pain.   senna 8.6 MG Tabs tablet Commonly known as: SENOKOT Take 1 tablet (8.6 mg total) by mouth daily as needed for mild  constipation.         Signed: Susanne Borders 10/10/2022, 12:51 PM

## 2022-10-10 NOTE — Anesthesia Preprocedure Evaluation (Signed)
Anesthesia Evaluation  Patient identified by MRN, date of birth, ID band Patient awake    Reviewed: Allergy & Precautions, H&P , NPO status , Patient's Chart, lab work & pertinent test results, reviewed documented beta blocker date and time   Airway Mallampati: II  TM Distance: >3 FB Neck ROM: full    Dental  (+) Teeth Intact   Pulmonary neg pulmonary ROS, former smoker   Pulmonary exam normal        Cardiovascular Exercise Tolerance: Good negative cardio ROS Normal cardiovascular exam Rhythm:regular Rate:Normal     Neuro/Psych   Anxiety      Neuromuscular disease  negative psych ROS   GI/Hepatic negative GI ROS, Neg liver ROS,,,  Endo/Other  negative endocrine ROS    Renal/GU negative Renal ROS  negative genitourinary   Musculoskeletal   Abdominal   Peds  Hematology negative hematology ROS (+)   Anesthesia Other Findings Past Medical History: No date: Anxiety No date: Arthralgia of right ankle 10/2020: Closed fracture of talus of right ankle No date: History of kidney stones No date: 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, right, sequela Past Surgical History: 06/18/2021: ANKLE FRACTURE SURGERY; Right No date: KNEE ARTHROSCOPY; Left BMI    Body Mass Index: 25.41 kg/m     Reproductive/Obstetrics negative OB ROS                             Anesthesia Physical Anesthesia Plan  ASA: 2  Anesthesia Plan: General ETT   Post-op Pain Management:    Induction:   PONV Risk Score and Plan: 3  Airway Management Planned:   Additional Equipment:   Intra-op Plan:   Post-operative Plan:   Informed Consent: I have reviewed the patients History and Physical, chart, labs and discussed the procedure including the risks, benefits and alternatives for the proposed anesthesia with the patient or authorized representative who has indicated his/her understanding and acceptance.     Dental  Advisory Given  Plan Discussed with: CRNA  Anesthesia Plan Comments:        Anesthesia Quick Evaluation

## 2022-10-21 NOTE — Progress Notes (Signed)
   REFERRING PHYSICIAN:  Jerl Mina, Md [address] [place],  [place] [address]  DOS: 10/10/22  successful placement of a [employer] spinal cord stimulator   HISTORY OF PRESENT ILLNESS: Gary Savage is approximately 2 weeks status post successful placement of a [employer] spinal cord stimulator. Was given robaxin and oxycodone on discharge from the hospital.   He has expected pain in his back from surgery. He feels some pulling when he moves the left arm. No relief with SCS yet- has not been turned on.   PHYSICAL EXAMINATION:  General: Patient is well developed, well nourished, calm, collected, and in no apparent distress.   NEUROLOGICAL:  General: In no acute distress.   Awake, alert, oriented to person, place, and time.  Pupils equal round and reactive to light.  Facial tone is symmetric.     Strength:            Side Iliopsoas Quads Hamstring PF DF EHL  R 5 5 5 5  4- 4-  L 5 5 5 5 5 5    Incisions c/d/I   ROS (Neurologic):  Negative except as noted above  IMAGING: Nothing new to review.   ASSESSMENT/PLAN:  Gary Savage is doing well s/p above surgery. Treatment options reviewed with patient and following plan made:   - I have advised the patient to lift up to 10 pounds until 6 weeks after surgery (follow up with Dr. Myer Haff).  - Reviewed wound care.  - No bending, twisting, or lifting.  - Continue on current medications including prn oxycodone. Was given small refill. PMP reviewed and is appropriate.  - Continue prn robaxin.  - [employer] rep met with patient today for some reprogramming. - Follow up as scheduled in 4 weeks and prn.   BP was elevated. No symptoms of chest pain, shortness of breath, blurry vision, or headaches. He feels fine. He said he has white coat HTN. It did come down at end of visit, but was still elevated.   He will recheck at home and call PCP if not improved. If he develops CP, SOB, blurry vision, or headaches,  then he will go to ED.     Advised to contact the office if any questions or concerns arise.  Drake Leach PA-C Department of neurosurgery

## 2022-10-22 ENCOUNTER — Ambulatory Visit (INDEPENDENT_AMBULATORY_CARE_PROVIDER_SITE_OTHER): Payer: PRIVATE HEALTH INSURANCE | Admitting: Orthopedic Surgery

## 2022-10-22 ENCOUNTER — Encounter: Payer: Self-pay | Admitting: Orthopedic Surgery

## 2022-10-22 VITALS — BP 118/92 | HR 101 | Temp 98.5°F

## 2022-10-22 DIAGNOSIS — Z9689 Presence of other specified functional implants: Secondary | ICD-10-CM

## 2022-10-22 DIAGNOSIS — S8401XS Injury of tibial nerve at lower leg level, right leg, sequela: Secondary | ICD-10-CM

## 2022-10-22 DIAGNOSIS — G894 Chronic pain syndrome: Secondary | ICD-10-CM

## 2022-10-22 DIAGNOSIS — S81801S Unspecified open wound, right lower leg, sequela: Secondary | ICD-10-CM

## 2022-10-22 DIAGNOSIS — G5771 Causalgia of right lower limb: Secondary | ICD-10-CM

## 2022-10-22 MED ORDER — OXYCODONE HCL 5 MG PO TABS
5.0000 mg | ORAL_TABLET | Freq: Four times a day (QID) | ORAL | 0 refills | Status: DC | PRN
Start: 2022-10-22 — End: 2022-11-18

## 2022-11-10 ENCOUNTER — Other Ambulatory Visit: Payer: Self-pay | Admitting: Neurosurgery

## 2022-11-18 ENCOUNTER — Ambulatory Visit (INDEPENDENT_AMBULATORY_CARE_PROVIDER_SITE_OTHER): Payer: PRIVATE HEALTH INSURANCE | Admitting: Neurosurgery

## 2022-11-18 ENCOUNTER — Encounter: Payer: Self-pay | Admitting: Neurosurgery

## 2022-11-18 VITALS — BP 138/82 | Ht 72.0 in | Wt 187.0 lb

## 2022-11-18 DIAGNOSIS — G5771 Causalgia of right lower limb: Secondary | ICD-10-CM

## 2022-11-18 DIAGNOSIS — Z09 Encounter for follow-up examination after completed treatment for conditions other than malignant neoplasm: Secondary | ICD-10-CM

## 2022-11-18 NOTE — Progress Notes (Signed)
   REFERRING PHYSICIAN:  Jerl Mina, Md [address] [place],  [place] [address]  DOS: 10/10/22  successful placement of a Med[employer]onic spinal cord stimulator   HISTORY OF PRESENT ILLNESS: JADEN THEUS is status post successful placement of a Med[employer]onic spinal cord stimulator.   His foot is feeling much better.  He is having some pain in the left side of his thoracic paraspinous muscles.    PHYSICAL EXAMINATION:  General: Patient is well developed, well nourished, calm, collected, and in no apparent distress.   NEUROLOGICAL:  General: In no acute distress.   Awake, alert, oriented to person, place, and time.  Pupils equal round and reactive to light.  Facial tone is symmetric.     Strength:            Side Iliopsoas Quads Hamstring PF DF EHL  R 5 5 5 5  4- 4-  L 5 5 5 5 5 5    Incisions c/d/I   ROS (Neurologic):  Negative except as noted above  IMAGING: Nothing new to review.   ASSESSMENT/PLAN:  MELANIE HUME is doing well s/p above surgery.  I think his left-sided muscular pain will improve over time.  If it does not, we can consider trigger point injections for him.  I will see him back on an as-needed basis.  Venetia Night MD Department of neurosurgery

## 2022-11-24 ENCOUNTER — Encounter: Payer: Self-pay | Admitting: Neurosurgery

## 2022-11-24 MED ORDER — CYCLOBENZAPRINE HCL 10 MG PO TABS: 10.0000 mg | ORAL_TABLET | Freq: Three times a day (TID) | ORAL | 0 refills | Status: DC | PRN

## 2023-01-14 ENCOUNTER — Other Ambulatory Visit: Payer: Self-pay | Admitting: Neurosurgery

## 2023-02-16 ENCOUNTER — Encounter: Payer: Self-pay | Admitting: Pain Medicine

## 2023-02-16 NOTE — Progress Notes (Unsigned)
PROVIDER NOTE: Information contained herein reflects review and annotations entered in association with encounter. Interpretation of such information and data should be left to medically-trained personnel. Information provided to patient can be located elsewhere in the medical record under "Patient Instructions". Document created using STT-dictation technology, any transcriptional errors that may result from process are unintentional.    Patient: Gary Savage  Service Category: E/M  Provider: Oswaldo Done, MD  DOB: [birth year]  DOS: 02/18/2023  Referring Provider: Jerl Mina, MD  MRN: [id number]  Specialty: Interventional Pain Management  PCP: Jerl Mina, MD  Type: Established Patient  Setting: Ambulatory outpatient    Location: Office  Delivery: Face-to-face     HPI  [name], a 47 y.o. year old male, is here today because of his No primary diagnosis found.. Gary Savage's primary complain today is No chief complaint on file.  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 Pain Assessment: Severity of   is reported as a  /10. Location:    / . Onset:  . Quality:  . Timing:  . Modifying factor(s):  Marland Kitchen Vitals:  vitals were not taken for this visit.  BMI: Estimated body mass index is 25.36 kg/m as calculated from the following:   Height as of 11/18/22: 6' (1.829 m).   Weight as of 11/18/22: 187 lb (84.8 kg). Last  encounter: 07/14/2022. Last procedure: 09/01/2022.  Reason for encounter:  *** . ***  Pharmacotherapy Assessment  Analgesic: None MME/day: 0 mg/day   Monitoring: [place] PMP: PDMP reviewed during this encounter.       Pharmacotherapy: No side-effects or adverse reactions reported. Compliance: No problems identified. Effectiveness: Clinically acceptable.  No notes on file  No results found for: "CBDTHCR" No results found for: "D8THCCBX" No results found for: "D9THCCBX"  UDS:  Summary  Date Value Ref Range Status  10/02/2021 Note  Final    Comment:    ==================================================================== Compliance Drug Analysis, Ur ==================================================================== Test                             Result       Flag       Units  Drug Present and Declared for Prescription Verification   Gabapentin                     PRESENT      EXPECTED  Drug Present not Declared for Prescription Verification   Ephedrine/Pseudoephedrine      PRESENT      UNEXPECTED   Phenylpropanolamine            PRESENT      UNEXPECTED    Source of ephedrine/pseudoephedrine is most commonly pseudoephedrine    in over-the-counter or prescription cold and allergy medications.    Phenylpropanolamine is an expected metabolite of    ephedrine/pseudoephedrine.  ==================================================================== Test  Result    Flag   Units      Ref Range   Creatinine              30               mg/dL      >=63 ==================================================================== Declared Medications:  The flagging and interpretation on this report are based on the  following declared medications.  Unexpected results may arise from  inaccuracies in the declared medications.   **Note: The testing scope of this panel includes these medications:   Gabapentin  (Neurontin) ==================================================================== For clinical consultation, please call [phone]. ====================================================================       ROS  Constitutional: Denies any fever or chills Gastrointestinal: No reported hemesis, hematochezia, vomiting, or acute GI distress Musculoskeletal: Denies any acute onset joint swelling, redness, loss of ROM, or weakness Neurological: No reported episodes of acute onset apraxia, aphasia, dysarthria, agnosia, amnesia, paralysis, loss of coordination, or loss of consciousness  Medication Review  amitriptyline, cyclobenzaprine, and gabapentin  History Review  Allergy: Gary Savage is allergic to penicillins and hydrocodone. Drug: Gary Savage  reports no history of drug use. Alcohol:  reports current alcohol use. Tobacco:  reports that he quit smoking about 6 years ago. His smoking use included cigarettes. He has never used smokeless tobacco. Social: Gary Savage  reports that he quit smoking about 6 years ago. His smoking use included cigarettes. He has never used smokeless tobacco. He reports current alcohol use. He reports that he does not use drugs. 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 Surgical: Gary Savage  has a past surgical history that includes Knee arthroscopy (Left); Ankle fracture surgery (Right, 06/18/2021); and Thoracic laminectomy for spinal cord stimulator (N/A, 10/10/2022). Family: family history includes Healthy in his mother.  Laboratory Chemistry Profile   Renal Lab Results  Component Value Date   BUN 8 09/30/2022   CREATININE 1.03 09/30/2022   GFRAA >60 10/14/2017   GFRNONAA >60 09/30/2022    Hepatic No results found for: "AST", "ALT", "ALBUMIN", "ALKPHOS", "HCVAB", "AMYLASE",  "LIPASE", "AMMONIA"  Electrolytes Lab Results  Component Value Date   NA 136 09/30/2022   K 3.4 (L) 09/30/2022   CL 103 09/30/2022   CALCIUM 9.0 09/30/2022    Bone No results found for: "VD25OH", "VD125OH2TOT", "FT7322GU5", "KY7062BJ6", "25OHVITD1", "25OHVITD2", "25OHVITD3", "TESTOFREE", "TESTOSTERONE"  Inflammation (CRP: Acute Phase) (ESR: Chronic Phase) No results found for: "CRP", "ESRSEDRATE", "LATICACIDVEN"       Note: Above Lab results reviewed.  Recent Imaging Review  DG Thoracic Spine 2 View CLINICAL DATA:  [id number] Surgery, elective [id number]. Spinal cord stimulator placement.  EXAM: THORACIC SPINE 2 VIEWS  COMPARISON:  Outside MRI thoracic spine 10/02/2022.  FLUOROSCOPY: Exposure Index (as provided by the fluoroscopic device): 1.384 mGy reference air Kerma.  Fluoroscopy time: 5 seconds.  FINDINGS: Multiple intraoperative fluoroscopic spot images are provided without a radiologist present for the procedure described above.  IMPRESSION: Multiple intraoperative fluoroscopic spot images are provided without a radiologist present for the procedure described above.  Electronically Signed   By: Orvan Falconer M.D.   On: 10/10/2022 14:12 DG C-Arm 1-60 Min-No Report Fluoroscopy was utilized by the requesting physician.  No radiographic  interpretation.  Note: Reviewed        Physical Exam  General appearance: Well nourished, well developed, and well hydrated. In  no apparent acute distress Mental status: Alert, oriented x 3 (person, place, & time)       Respiratory: No evidence of acute respiratory distress Eyes: PERLA Vitals: There were no vitals taken for this visit. BMI: Estimated body mass index is 25.36 kg/m as calculated from the following:   Height as of 11/18/22: 6' (1.829 m).   Weight as of 11/18/22: 187 lb (84.8 kg). Ideal: Patient weight not recorded  Assessment   Diagnosis Status  No diagnosis found. Controlled Controlled Controlled   Updated  Problems: No problems updated.  Plan of Care  Problem-specific:  No problem-specific Assessment & Plan notes found for this encounter.  Mr. DAQWON BURKETTE has a current medication list which includes the following long-term medication(s): amitriptyline and gabapentin.  Pharmacotherapy (Medications Ordered): No orders of the defined types were placed in this encounter.  Orders:  No orders of the defined types were placed in this encounter.  Follow-up plan:   No follow-ups on file.      Interventional Therapies  Risk  Complexity Considerations:   Day of implant vs. Removal day images.       Planned  Pending:   The patient has decided to proceed with the permanent implant.  He will be referred to Dr. Marcell Barlow for permanent implant of the device.   Under consideration:   Possible right spinal cord stimulator trial    Completed:   Diagnostic right spinal cord stimulator trial (08/26/2022) (100/100/75/75 + 100% relief of spasms)  Diagnostic/therapeutic right sural + plantar NB x2 (03/18/2022) (4-0/10) (100/100/100 x 2 days/<30)   Completed by other providers:   [facility]   Therapeutic  Palliative (PRN) options:   None established        Recent Visits No visits were found meeting these conditions. Showing recent visits within past 90 days and meeting all other requirements Future Appointments Date Type Provider Dept  02/18/23 Appointment Delano Metz, MD [facility]  Showing future appointments within next 90 days and meeting all other requirements  I discussed the assessment and treatment plan with the patient. The patient was provided an opportunity to ask questions and all were answered. The patient agreed with the plan and demonstrated an understanding of the instructions.  Patient advised to call back or seek an in-person evaluation if the symptoms or condition worsens.  Duration of encounter: *** minutes.  Total time on encounter, as  per AMA guidelines included both the face-to-face and non-face-to-face time personally spent by the physician and/or other qualified health care professional(s) on the day of the encounter (includes time in activities that require the physician or other qualified health care professional and does not include time in activities normally performed by clinical staff). Physician's time may include the following activities when performed: Preparing to see the patient (e.g., pre-charting review of records, searching for previously ordered imaging, lab work, and nerve conduction tests) Review of prior analgesic pharmacotherapies. Reviewing PMP Interpreting ordered tests (e.g., lab work, imaging, nerve conduction tests) Performing post-procedure evaluations, including interpretation of diagnostic procedures Obtaining and/or reviewing separately obtained history Performing a medically appropriate examination and/or evaluation Counseling and educating the patient/family/caregiver Ordering medications, tests, or procedures Referring and communicating with other health care professionals (when not separately reported) Documenting clinical information in the electronic or other health record Independently interpreting results (not separately reported) and communicating results to the patient/ family/caregiver Care coordination (not separately reported)  Note by: Oswaldo Done, MD Date: 02/18/2023; Time: 1:05 PM

## 2023-02-18 ENCOUNTER — Ambulatory Visit: Payer: PRIVATE HEALTH INSURANCE | Attending: Pain Medicine | Admitting: Pain Medicine

## 2023-02-18 ENCOUNTER — Encounter: Payer: Self-pay | Admitting: Pain Medicine

## 2023-02-18 VITALS — BP 121/83 | HR 108 | Temp 97.4°F | Resp 16 | Ht 72.0 in | Wt 180.0 lb

## 2023-02-18 DIAGNOSIS — M25471 Effusion, right ankle: Secondary | ICD-10-CM | POA: Diagnosis present

## 2023-02-18 DIAGNOSIS — Z9682 Presence of neurostimulator: Secondary | ICD-10-CM | POA: Diagnosis present

## 2023-02-18 DIAGNOSIS — R9389 Abnormal findings on diagnostic imaging of other specified body structures: Secondary | ICD-10-CM | POA: Diagnosis present

## 2023-02-18 DIAGNOSIS — S8401XS Injury of tibial nerve at lower leg level, right leg, sequela: Secondary | ICD-10-CM | POA: Insufficient documentation

## 2023-02-18 DIAGNOSIS — M25671 Stiffness of right ankle, not elsewhere classified: Secondary | ICD-10-CM | POA: Insufficient documentation

## 2023-02-18 DIAGNOSIS — G8929 Other chronic pain: Secondary | ICD-10-CM | POA: Insufficient documentation

## 2023-02-18 DIAGNOSIS — S92121S Displaced fracture of body of right talus, sequela: Secondary | ICD-10-CM | POA: Insufficient documentation

## 2023-02-18 DIAGNOSIS — M19271 Secondary osteoarthritis, right ankle and foot: Secondary | ICD-10-CM | POA: Insufficient documentation

## 2023-02-18 DIAGNOSIS — M25571 Pain in right ankle and joints of right foot: Secondary | ICD-10-CM | POA: Diagnosis present

## 2023-02-18 DIAGNOSIS — S81801S Unspecified open wound, right lower leg, sequela: Secondary | ICD-10-CM | POA: Insufficient documentation

## 2023-02-18 DIAGNOSIS — S9401XS Injury of lateral plantar nerve, right leg, sequela: Secondary | ICD-10-CM | POA: Insufficient documentation

## 2023-02-18 MED ORDER — PREDNISONE 20 MG PO TABS
ORAL_TABLET | ORAL | 0 refills | Status: AC
Start: 2023-02-18 — End: 2023-02-27

## 2023-02-18 NOTE — Patient Instructions (Addendum)

## 2023-03-05 ENCOUNTER — Ambulatory Visit
Admission: RE | Admit: 2023-03-05 | Discharge: 2023-03-05 | Disposition: A | Discharge status: Home / Self Care | Payer: BC Managed Care – PPO | Source: Ambulatory Visit | Attending: Pain Medicine | Admitting: Pain Medicine

## 2023-03-05 ENCOUNTER — Encounter: Payer: Self-pay | Admitting: Pain Medicine

## 2023-03-05 ENCOUNTER — Ambulatory Visit: Payer: PRIVATE HEALTH INSURANCE | Attending: Pain Medicine | Admitting: Pain Medicine

## 2023-03-05 VITALS — BP 116/88 | HR 118 | Temp 97.3°F | Resp 13 | Ht 72.0 in | Wt 190.0 lb

## 2023-03-05 DIAGNOSIS — S9401XS Injury of lateral plantar nerve, right leg, sequela: Secondary | ICD-10-CM | POA: Diagnosis present

## 2023-03-05 DIAGNOSIS — G8929 Other chronic pain: Secondary | ICD-10-CM | POA: Diagnosis present

## 2023-03-05 DIAGNOSIS — S86311S Strain of muscle(s) and tendon(s) of peroneal muscle group at lower leg level, right leg, sequela: Secondary | ICD-10-CM | POA: Insufficient documentation

## 2023-03-05 DIAGNOSIS — M25471 Effusion, right ankle: Secondary | ICD-10-CM | POA: Diagnosis present

## 2023-03-05 DIAGNOSIS — M25571 Pain in right ankle and joints of right foot: Secondary | ICD-10-CM | POA: Insufficient documentation

## 2023-03-05 DIAGNOSIS — G5781 Other specified mononeuropathies of right lower limb: Secondary | ICD-10-CM | POA: Insufficient documentation

## 2023-03-05 DIAGNOSIS — S92121S Displaced fracture of body of right talus, sequela: Secondary | ICD-10-CM | POA: Diagnosis present

## 2023-03-05 DIAGNOSIS — M19271 Secondary osteoarthritis, right ankle and foot: Secondary | ICD-10-CM | POA: Insufficient documentation

## 2023-03-05 DIAGNOSIS — S81801S Unspecified open wound, right lower leg, sequela: Secondary | ICD-10-CM | POA: Insufficient documentation

## 2023-03-05 DIAGNOSIS — S8401XS Injury of tibial nerve at lower leg level, right leg, sequela: Secondary | ICD-10-CM | POA: Insufficient documentation

## 2023-03-05 DIAGNOSIS — M25671 Stiffness of right ankle, not elsewhere classified: Secondary | ICD-10-CM | POA: Diagnosis present

## 2023-03-05 MED ORDER — PENTAFLUOROPROP-TETRAFLUOROETH EX AERO: INHALATION_SPRAY | Freq: Once | CUTANEOUS | Status: DC

## 2023-03-05 MED ORDER — LIDOCAINE HCL 2 % IJ SOLN
20.0000 mL | Freq: Once | INTRAMUSCULAR | Status: AC
  Administered 2023-03-05: 200 mg
  Filled 2023-03-05: qty 20

## 2023-03-05 MED ORDER — ROPIVACAINE HCL 2 MG/ML IJ SOLN
5.0000 mL | Freq: Once | INTRAMUSCULAR | Status: AC
  Administered 2023-03-05: 5 mL
  Filled 2023-03-05: qty 20

## 2023-03-05 MED ORDER — LACTATED RINGERS IV SOLN: Freq: Once | INTRAVENOUS | Status: DC

## 2023-03-05 MED ORDER — MIDAZOLAM HCL 2 MG/2ML IJ SOLN: 0.5000 mg | Freq: Once | INTRAMUSCULAR | Status: DC

## 2023-03-05 MED ORDER — METHYLPREDNISOLONE ACETATE 80 MG/ML IJ SUSP
80.0000 mg | Freq: Once | INTRAMUSCULAR | Status: AC
  Administered 2023-03-05: 80 mg
  Filled 2023-03-05: qty 1

## 2023-03-05 NOTE — Progress Notes (Signed)
Safety precautions to be maintained throughout the outpatient stay will include: orient to surroundings, keep bed in low position, maintain call bell within reach at all times, provide assistance with transfer out of bed and ambulation.  

## 2023-03-05 NOTE — Progress Notes (Signed)
PROVIDER NOTE: Interpretation of information contained herein should be left to medically-trained personnel. Specific patient instructions are provided elsewhere under "Patient Instructions" section of medical record. This document was created in part using STT-dictation technology, any transcriptional errors that may result from this process are unintentional.  Patient: Gary Savage Type: Established DOB: 1975/09/19 MRN: [id number] PCP: Jerl Mina, MD  Service: Procedure DOS: 03/05/2023 Setting: Ambulatory Location: Ambulatory outpatient facility Delivery: Face-to-face Provider: Oswaldo Done, MD Specialty: Interventional Pain Management Specialty designation: 09 Location: Outpatient facility Ref. Prov.: Jerl Mina, MD       Interventional Therapy     Procedure:          Anesthesia, Analgesia, Anxiolysis:  Type: Diagnostic True Ankle Steroid Injection        #1  Region: Dorsal  and medial aspect of the ankle        Target Area:  Posterior tibial nerve   and superficial peroneal nerve Level: Ankle Laterality: Right  Type: Local Anesthesia Local Anesthetic: Lidocaine 1-2% Sedation: None  Indication(s):  Analgesia Route: Infiltration ([place]/IM) IV Access: N/A   Position: Supine   1. Arthralgia of ankle (Right)   2. Chronic ankle pain (1ry area of Pain) (Right)   3. Closed fracture of talus, sequela (Right)   4. Injury of lateral plantar nerve, sequela (Right)   5. 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 Neuropathy of sural nerve (Right)   7. Pain and swelling of ankle (Right)   8. 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 Stiffness of ankle joint (Right)   10. Strain of peroneal tendon of right foot, sequela   11. Swelling of ankle (Right)    NAS-11 Pain score:   Pre-procedure: 5 /10   Post-procedure: 0-No pain/10     H&P (Pre-op Assessment):  Gary Savage is a 47 y.o. (year old), male patient, seen today for  interventional treatment. He  has a past surgical history that includes Knee arthroscopy (Left); Ankle fracture surgery (Right, 06/18/2021); and Thoracic laminectomy for spinal cord stimulator (N/A, 10/10/2022). Gary Savage has a current medication list which includes the following prescription(s): amitriptyline, cyclobenzaprine, and gabapentin, and the following Facility-Administered Medications: pentafluoroprop-tetrafluoroeth. His primarily concern today is the Ankle Pain (Right ankle and foot )  Initial Vital Signs:  Pulse/HCG Rate: (!) 131ECG Heart Rate: (!) 104 Temp: (!) 97.3 F (36.3 C) Resp: 16 BP: (!) 129/107 SpO2: 98 %  BMI: Estimated body mass index is 25.77 kg/m as calculated from the following:   Height as of this encounter: 6' (1.829 m).   Weight as of this encounter: 190 lb (86.2 kg).  Risk Assessment: Allergies: Reviewed. He is allergic to penicillins and hydrocodone.  Allergy Precautions: None required Coagulopathies: Reviewed. None identified.  Blood-thinner therapy: None at this time Active Infection(s): Reviewed. None identified. Gary Savage is afebrile  Site Confirmation: Gary Savage was asked to confirm the procedure and laterality before marking the site Procedure checklist: Completed Consent: Before the procedure and under the influence of no sedative(s), amnesic(s), or anxiolytics, the patient was informed of the treatment options, risks and possible complications. To fulfill our ethical and legal obligations, as recommended by the American Medical Association's Code of Ethics, I have informed the patient of my clinical impression; the nature and purpose of the treatment or procedure; the risks, benefits, and possible complications of the intervention; the alternatives, including doing nothing; the risk(s) and benefit(s) of the alternative treatment(s) or procedure(s); and the risk(s)  and benefit(s) of doing nothing. The patient was provided information about the  general risks and possible complications associated with the procedure. These may include, but are not limited to: failure to achieve desired goals, infection, bleeding, organ or nerve damage, allergic reactions, paralysis, and death. In addition, the patient was informed of those risks and complications associated to the procedure, such as failure to decrease pain; infection; bleeding; organ or nerve damage with subsequent damage to sensory, motor, and/or autonomic systems, resulting in permanent pain, numbness, and/or weakness of one or several areas of the body; allergic reactions; (i.e.: anaphylactic reaction); and/or death. Furthermore, the patient was informed of those risks and complications associated with the medications. These include, but are not limited to: allergic reactions (i.e.: anaphylactic or anaphylactoid reaction(s)); adrenal axis suppression; blood sugar elevation that in diabetics may result in ketoacidosis or comma; water retention that in patients with history of congestive heart failure may result in shortness of breath, pulmonary edema, and decompensation with resultant heart failure; weight gain; swelling or edema; medication-induced neural toxicity; particulate matter embolism and blood vessel occlusion with resultant organ, and/or nervous system infarction; and/or aseptic necrosis of one or more joints. Finally, the patient was informed that Medicine is not an exact science; therefore, there is also the possibility of unforeseen or unpredictable risks and/or possible complications that may result in a catastrophic outcome. The patient indicated having understood very clearly. We have given the patient no guarantees and we have made no promises. Enough time was given to the patient to ask questions, all of which were answered to the patient's satisfaction. Gary Savage has indicated that he wanted to continue with the procedure. Attestation: I, the ordering provider, attest that I  have discussed with the patient the benefits, risks, side-effects, alternatives, likelihood of achieving goals, and potential problems during recovery for the procedure that I have provided informed consent. Date  Time: 03/05/2023 10:20 AM  Pre-Procedure Preparation:  Monitoring: As per clinic protocol. Respiration, ETCO2, SpO2, BP, heart rate and rhythm monitor placed and checked for adequate function Safety Precautions: Patient was assessed for positional comfort and pressure points before starting the procedure. Time-out: I initiated and conducted the "Time-out" before starting the procedure, as per protocol. The patient was asked to participate by confirming the accuracy of the "Time Out" information. Verification of the correct person, site, and procedure were performed and confirmed by me, the nursing staff, and the patient. "Time-out" conducted as per Joint Commission's Universal Protocol (UP.01.01.01). Time: 1146 Start Time: 1146 hrs.  Description of Procedure:          Approach: Anterolateral and medial aspect of the ankle at them behind the medial malleolus approach. Area Prepped: Entire ankle Region ChloraPrep (2% chlorhexidine gluconate and 70% isopropyl alcohol) Safety Precautions: Aspiration looking for blood return was conducted prior to all injections. At no point did we inject any substances, as a needle was being advanced. No attempts were made at seeking any paresthesias. Safe injection practices and needle disposal techniques used. Medications properly checked for expiration dates. SDV (single dose vial) medications used. Description of the Procedure: Protocol guidelines were followed. The patient was placed in position. The target area was identified and the area prepped in the usual manner. Skin & deeper tissues infiltrated with local anesthetic. Appropriate amount of time allowed to pass for local anesthetics to take effect. The procedure needles were then advanced to the  target area. Proper needle placement secured. Negative aspiration confirmed. Solution injected in intermittent fashion, asking  for systemic symptoms every 0.5cc of injectate. The needles were then removed and the area cleansed, making sure to leave some of the prepping solution back to take advantage of its long term bactericidal properties.                                                Vitals:   03/05/23 1022 03/05/23 1146 03/05/23 1150 03/05/23 1152  BP: 109/79 (!) 126/90 (!) 120/97 116/88  Pulse: (!) 118     Resp:  20 18 13   Temp:      TempSrc:      SpO2:  98% 98% 98%  Weight:      Height:        Start Time: 1146 hrs. End Time: 1151 hrs.  Materials:  Needle(s) Type: Spinal Needle Gauge: 22G Length: 3.5-in Medication(s): Please see orders for medications and dosing details.  Type of Imaging Technique: None used Indication(s): N/A Exposure Time: No patient exposure Contrast: None used. Fluoroscopic Guidance: N/A Ultrasound Guidance: N/A Interpretation: N/A  Antibiotic Prophylaxis:   Anti-infectives (From admission, onward)    None      Indication(s): None identified  Post-operative Assessment:  Post-procedure Vital Signs:  Pulse/HCG Rate: (!) 118(!) 108 Temp: (!) 97.3 F (36.3 C) Resp: 13 BP: 116/88 SpO2: 98 %  EBL: None  Complications: No immediate post-treatment complications observed by team, or reported by patient.  Note: The patient tolerated the entire procedure well. A repeat set of vitals were taken after the procedure and the patient was kept under observation following institutional policy, for this type of procedure. Post-procedural neurological assessment was performed, showing return to baseline, prior to discharge. The patient was provided with post-procedure discharge instructions, including a section on how to identify potential problems. Should any problems arise concerning this procedure, the patient was given instructions to  immediately contact us, at any time, without hesitation. In any case, we plan to contact the patient by telephone for a follow-up status report regarding this interventional procedure.  Comments:  No additional relevant information.  Plan of Care (POC)  Orders:  Orders Placed This Encounter  Procedures   Medium Joint Injection/Arthrocentesis    Level(s): Ankle Laterality: Right Sedation: Patient's choice Purpose: Diagnostic Indication(s): Chronic    Standing Status:   Future    Standing Expiration Date:   06/05/2023    Scheduling Instructions:     Requested Scheduling Timeframe: Today   [facility] C-ARM 1-60 MIN NO REPORT    Intraoperative interpretation by procedural physician at [facility].    Standing Status:   Standing    Number of Occurrences:   1    Order Specific Question:   Reason for exam:    Answer:   Assistance in needle guidance and placement for procedures requiring needle placement in or near specific anatomical locations not easily accessible without such assistance.   Informed Consent Details: Physician/Practitioner Attestation; Transcribe to consent form and obtain patient signature    Nursing Order: Transcribe to consent form and obtain patient signature. Note: Always confirm laterality of pain with Mr. Dorrance, before procedure.    Order Specific Question:   Physician/Practitioner attestation of informed consent for procedure/surgical case    Answer:   I, the physician/practitioner, attest that I have discussed with the patient the benefits, risks, side effects, alternatives, likelihood of achieving goals and  potential problems during recovery for the procedure that I have provided informed consent.    Order Specific Question:   Procedure    Answer:   Right ankle steroid injection    Order Specific Question:   Physician/Practitioner performing the procedure    Answer:   Bensen Chadderdon A. Laban Emperor, MD    Order Specific Question:   Indication/Reason     Answer:   Chronic right ankle pain   Provide equipment / supplies at bedside    Procedure tray: "Block Tray" (Disposable  single use) Skin infiltration needle: Regular 1.5-in, 25-G, (x1) Block Needle type: Regular Amount/quantity: 1 Size: Short(1.5-inch) Gauge: 22G    Standing Status:   Standing    Number of Occurrences:   1    Order Specific Question:   Specify    Answer:   Block Tray   Chronic Opioid Analgesic:  None MME/day: 0 mg/day   Medications ordered for procedure: Meds ordered this encounter  Medications   lidocaine (XYLOCAINE) 2 % (with pres) injection 400 mg   pentafluoroprop-tetrafluoroeth (GEBAUERS) aerosol   DISCONTD: lactated ringers infusion   DISCONTD: midazolam (VERSED) injection 0.5-2 mg    Make sure Flumazenil is available in the pyxis when using this medication. If oversedation occurs, administer 0.2 mg IV over 15 sec. If after 45 sec no response, administer 0.2 mg again over 1 min; may repeat at 1 min intervals; not to exceed 4 doses (1 mg)   ropivacaine (PF) 2 mg/mL (0.2%) (NAROPIN) injection 5 mL   methylPREDNISolone acetate (DEPO-MEDROL) injection 80 mg   Medications administered: We administered lidocaine, ropivacaine (PF) 2 mg/mL (0.2%), and methylPREDNISolone acetate.  See the medical record for exact dosing, route, and time of administration.  Follow-up plan:   Return in about 2 weeks (around 03/19/2023) for (Face2F), (PPE).       Interventional Therapies  Risk  Complexity Considerations:   Day of implant vs. Removal day images.       Planned  Pending:   Oral steroid taper (02/18/2023)  Palliative/therapeutic right ankle joint injection #1    Under consideration:      Completed:   Diagnostic right spinal cord stimulator trial (08/26/2022) (100/100/75/75 + 100% relief of spasms)  Diagnostic/therapeutic right sural + plantar NB x2 (03/18/2022) (4-0/10) (100/100/100 x 2 days/<30)  Permanent spinal cord stimulator implant by Dr. Volanda Napoleon.   Completed by other providers:   [facility]   Therapeutic  Palliative (PRN) options:   None established       Recent Visits Date Type Provider Dept  02/18/23 Office Visit Delano Metz, MD [facility]  Showing recent visits within past 90 days and meeting all other requirements Today's Visits Date Type Provider Dept  03/05/23 Procedure visit Delano Metz, MD [facility]  Showing today's visits and meeting all other requirements Future Appointments Date Type Provider Dept  03/18/23 Appointment Delano Metz, MD [facility]  Showing future appointments within next 90 days and meeting all other requirements  Disposition: Discharge home  Discharge (Date  Time): 03/05/2023; 1155 hrs.   Primary Care Physician: Jerl Mina, MD Location: [facility] Note by: Oswaldo Done, MD (TTS technology used. I apologize for any typographical errors that were not detected and corrected.) Date: 03/05/2023; Time: 1:30 PM  Disclaimer:  Medicine is not an Visual merchandiser. The only guarantee in medicine is that nothing is guaranteed. It is important to note that the decision to proceed with this intervention was based on the  information collected from the patient. The Data and conclusions were drawn from the patient's questionnaire, the interview, and the physical examination. Because the information was provided in large part by the patient, it cannot be guaranteed that it has not been purposely or unconsciously manipulated. Every effort has been made to obtain as much relevant data as possible for this evaluation. It is important to note that the conclusions that lead to this procedure are derived in large part from the available data. Always take into account that the treatment will also be dependent on availability of resources and existing treatment guidelines, considered by other Pain Management  Practitioners as being common knowledge and practice, at the time of the intervention. For Medico-Legal purposes, it is also important to point out that variation in procedural techniques and pharmacological choices are the acceptable norm. The indications, contraindications, technique, and results of the above procedure should only be interpreted and judged by a Board-Certified Interventional Pain Specialist with extensive familiarity and expertise in the same exact procedure and technique.

## 2023-03-05 NOTE — Patient Instructions (Signed)

## 2023-03-06 ENCOUNTER — Telehealth: Payer: Self-pay | Admitting: *Deleted

## 2023-03-06 NOTE — Telephone Encounter (Signed)
No problems post procedure. 

## 2023-03-17 NOTE — Progress Notes (Unsigned)
PROVIDER NOTE: Information contained herein reflects review and annotations entered in association with encounter. Interpretation of such information and data should be left to medically-trained personnel. Information provided to patient can be located elsewhere in the medical record under "Patient Instructions". Document created using STT-dictation technology, any transcriptional errors that may result from process are unintentional.    Patient: Gary Savage  Service Category: E/M  Provider: Oswaldo Done, MD  DOB: 1976-04-12  DOS: 03/18/2023  Referring Provider: Jerl Mina, MD  MRN: [id number]  Specialty: Interventional Pain Management  PCP: Gary Mina, MD  Type: Established Patient  Setting: Ambulatory outpatient    Location: Office  Delivery: Face-to-face     HPI  [name], a 47 y.o. year old male, is here today because of his Chronic pain of right ankle [M25.571, G89.29]. [name] primary complain today is No chief complaint on file.  Pertinent problems: 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 Pain Assessment: Severity of   is reported as a  /10. Location:    / . Onset:  . Quality:  . Timing:  . Modifying factor(s):  Marland Kitchen Vitals:  vitals were not taken for this visit.  BMI: Estimated body mass index is 25.77 kg/m as calculated from the following:   Height as of 03/05/23: 6' (1.829 m).    Weight as of 03/05/23: 190 lb (86.2 kg). Last encounter: 02/18/2023. Last procedure: 03/05/2023.  Reason for encounter: post-procedure evaluation and assessment. *** Discussed the use of AI scribe software for clinical note transcription with the patient, who gave verbal consent to proceed.  History of Present Illness          Post-procedure evaluation    Procedure:          Anesthesia, Analgesia, Anxiolysis:  Type: Diagnostic True Ankle Steroid Injection        #1  Region: Dorsal  and medial aspect of the ankle        Target Area:  Posterior tibial nerve   and superficial peroneal nerve Level: Ankle Laterality: Right  Type: Local Anesthesia Local Anesthetic: Lidocaine 1-2% Sedation: None  Indication(s):  Analgesia Route: Infiltration ([place]/IM) IV Access: N/A   Position: Supine   1. Arthralgia of ankle (Right)   2. Chronic ankle pain (1ry area of Pain) (Right)   3. Closed fracture of talus, sequela (Right)   4. Injury of lateral plantar nerve, sequela (Right)   5. 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 Neuropathy of sural nerve (Right)   7. Pain and swelling of ankle (Right)   8. 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 Stiffness of ankle joint (Right)   10. Strain of peroneal tendon of right foot, sequela   11. Swelling of ankle (Right)    NAS-11 Pain score:   Pre-procedure: 5 /10   Post-procedure: 0-No pain/10      Effectiveness:  Initial hour after procedure:   ***. Subsequent 4-6 hours post-procedure:   ***. Analgesia past initial  6 hours:   ***. Ongoing improvement:  Analgesic:  *** Function:    ***    ROM:    ***     Pharmacotherapy Assessment  Analgesic: None MME/day: 0 mg/day   Monitoring: [place] PMP: PDMP reviewed during this encounter.       Pharmacotherapy: No side-effects or adverse reactions reported. Compliance: No problems identified. Effectiveness: Clinically acceptable.  No notes on file  No results  found for: "CBDTHCR" No results found for: "D8THCCBX" No results found for: "D9THCCBX"  UDS:  Summary  Date Value Ref Range Status  10/02/2021 Note  Final    Comment:    ==================================================================== Compliance Drug Analysis, Ur ==================================================================== Test                             Result       Flag       Units  Drug Present and Declared for Prescription Verification   Gabapentin                     PRESENT      EXPECTED  Drug Present not Declared for Prescription Verification   Ephedrine/Pseudoephedrine      PRESENT      UNEXPECTED   Phenylpropanolamine            PRESENT      UNEXPECTED    Source of ephedrine/pseudoephedrine is most commonly pseudoephedrine    in over-the-counter or prescription cold and allergy medications.    Phenylpropanolamine is an expected metabolite of    ephedrine/pseudoephedrine.  ==================================================================== Test                      Result    Flag   Units      Ref Range   Creatinine              30               mg/dL      >=16 ==================================================================== Declared Medications:  The flagging and interpretation on this report are based on the  following declared medications.  Unexpected results may arise from  inaccuracies in the declared medications.   **Note: The testing scope of this panel includes these medications:   Gabapentin (Neurontin) ==================================================================== For clinical consultation, please call [phone]. ====================================================================       ROS  Constitutional: Denies any fever or chills Gastrointestinal: No reported hemesis, hematochezia, vomiting, or acute GI distress Musculoskeletal: Denies any acute onset joint swelling, redness, loss of ROM, or weakness Neurological: No  reported episodes of acute onset apraxia, aphasia, dysarthria, agnosia, amnesia, paralysis, loss of coordination, or loss of consciousness  Medication Review  amitriptyline, cyclobenzaprine, and gabapentin  History Review  Allergy: [name] is allergic to penicillins and hydrocodone. Drug: [name]  reports no history of drug use. Alcohol:  reports current alcohol use. Tobacco:  reports that he quit smoking about 6 years ago. His smoking use included cigarettes. He has never used smokeless tobacco. Social: Gary Savage  reports that he quit smoking about 6 years ago. His smoking use included cigarettes. He has never used smokeless tobacco. He reports current alcohol use. He reports that he does not use drugs. 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 Surgical: Gary Savage  has a past surgical history that includes Knee arthroscopy (Left); Ankle fracture surgery (Right, 06/18/2021); and Thoracic laminectomy for spinal cord stimulator (N/A, 10/10/2022). Family: family history includes Healthy in his mother.  Laboratory Chemistry Profile   Renal Lab Results  Component Value Date   BUN 8 09/30/2022   CREATININE 1.03 09/30/2022   GFRAA >60 10/14/2017   GFRNONAA >60 09/30/2022    Hepatic No results found for: "AST", "ALT", "ALBUMIN", "ALKPHOS", "HCVAB", "AMYLASE", "LIPASE", "AMMONIA"  Electrolytes Lab Results  Component Value Date   NA 136 09/30/2022   K 3.4 (L) 09/30/2022   CL 103 09/30/2022   CALCIUM 9.0 09/30/2022    Bone No results found for: "VD25OH", "VD125OH2TOT", "EX5284XL2", "GM0102VO5", "25OHVITD1", "25OHVITD2", "25OHVITD3", "TESTOFREE", "TESTOSTERONE"  Inflammation (CRP: Acute Phase) (ESR: Chronic Phase) No results found for: "CRP", "ESRSEDRATE", "LATICACIDVEN"       Note: Above Lab results  reviewed.  Recent Imaging Review  DG PAIN CLINIC C-ARM 1-60 MIN NO REPORT Fluoro was used, but no Radiologist interpretation will be provided.  Please refer to "NOTES" tab for provider progress note. Note: Reviewed        Physical Exam  General appearance: Well nourished, well developed, and well hydrated. In no apparent acute distress Mental status: Alert, oriented x 3 (person, place, & time)       Respiratory: No evidence of acute respiratory distress Eyes: PERLA Vitals: There were no vitals taken for this visit. BMI: Estimated body mass index is 25.77 kg/m as calculated from the following:   Height as of 03/05/23: 6' (1.829 m).   Weight as of 03/05/23: 190 lb (86.2 kg). Ideal: Ideal body weight: 77.6 kg (171 lb 1.2 oz) Adjusted ideal body weight: 81 kg (178 lb 10.3 oz)  Assessment   Diagnosis Status  1. Chronic ankle pain (1ry area of Pain) (Right)   2. Arthralgia of ankle (Right)   3. Pain and swelling of ankle (Right)   4. Stiffness of ankle joint (Right)   5. Neuropathy of sural nerve (Right)   6. Injury of lateral plantar nerve, sequela (Right)   7. 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 Closed fracture of talus, sequela (Right)   9. Postop check    Controlled Controlled Controlled   Updated Problems: No problems updated.  Plan of Care  Problem-specific:  Assessment and Plan            Gary Savage has a current medication list which includes the following long-term medication(s): amitriptyline and gabapentin.  Pharmacotherapy (Medications Ordered): No orders of the defined types were placed in this encounter.  Orders:  No orders of the defined types were placed in this encounter.  Follow-up plan:   No follow-ups on file.      Interventional Therapies  Risk  Complexity Considerations:   Day of implant vs. Removal day images.       Planned  Pending:   Oral steroid taper (02/18/2023)  Palliative/therapeutic right  ankle joint injection #1    Under consideration:      Completed:   Diagnostic right spinal cord stimulator trial (08/26/2022) (100/100/75/75 + 100% relief of spasms)  Diagnostic/therapeutic right sural + plantar NB x2 (03/18/2022) (4-0/10) (100/100/100 x 2 days/<30)  Permanent spinal cord stimulator implant by Dr. Volanda Napoleon.   Completed by other providers:   [facility]   Therapeutic  Palliative (PRN) options:   None established        Recent Visits Date Type Provider Dept  03/05/23  Procedure visit Delano Metz, MD [facility]  02/18/23 Office Visit Delano Metz, MD [facility]  Showing recent visits within past 90 days and meeting all other requirements Future Appointments Date Type Provider Dept  03/18/23 Appointment Delano Metz, MD [facility]  Showing future appointments within next 90 days and meeting all other requirements  I discussed the assessment and treatment plan with the patient. The patient was provided an opportunity to ask questions and all were answered. The patient agreed with the plan and demonstrated an understanding of the instructions.  Patient advised to call back or seek an in-person evaluation if the symptoms or condition worsens.  Duration of encounter: *** minutes.  Total time on encounter, as per AMA guidelines included both the face-to-face and non-face-to-face time personally spent by the physician and/or other qualified health care professional(s) on the day of the encounter (includes time in activities that require the physician or other qualified health care professional and does not include time in activities normally performed by clinical staff). Physician's time may include the following activities when performed: Preparing to see the patient (e.g., pre-charting review of records, searching for previously ordered imaging, lab work, and nerve conduction tests) Review of prior analgesic  pharmacotherapies. Reviewing PMP Interpreting ordered tests (e.g., lab work, imaging, nerve conduction tests) Performing post-procedure evaluations, including interpretation of diagnostic procedures Obtaining and/or reviewing separately obtained history Performing a medically appropriate examination and/or evaluation Counseling and educating the patient/family/caregiver Ordering medications, tests, or procedures Referring and communicating with other health care professionals (when not separately reported) Documenting clinical information in the electronic or other health record Independently interpreting results (not separately reported) and communicating results to the patient/ family/caregiver Care coordination (not separately reported)  Note by: Gary Done, MD Date: 03/18/2023; Time: 8:30 AM

## 2023-03-18 ENCOUNTER — Encounter: Payer: Self-pay | Admitting: Pain Medicine

## 2023-03-18 ENCOUNTER — Telehealth: Payer: Self-pay

## 2023-03-18 ENCOUNTER — Ambulatory Visit: Payer: PRIVATE HEALTH INSURANCE | Attending: Pain Medicine | Admitting: Pain Medicine

## 2023-03-18 VITALS — BP 130/93 | Resp 16 | Ht 72.0 in | Wt 190.0 lb

## 2023-03-18 DIAGNOSIS — M25571 Pain in right ankle and joints of right foot: Secondary | ICD-10-CM | POA: Insufficient documentation

## 2023-03-18 DIAGNOSIS — M25671 Stiffness of right ankle, not elsewhere classified: Secondary | ICD-10-CM | POA: Diagnosis present

## 2023-03-18 DIAGNOSIS — S81801S Unspecified open wound, right lower leg, sequela: Secondary | ICD-10-CM | POA: Diagnosis present

## 2023-03-18 DIAGNOSIS — Z09 Encounter for follow-up examination after completed treatment for conditions other than malignant neoplasm: Secondary | ICD-10-CM | POA: Diagnosis present

## 2023-03-18 DIAGNOSIS — S9401XS Injury of lateral plantar nerve, right leg, sequela: Secondary | ICD-10-CM | POA: Insufficient documentation

## 2023-03-18 DIAGNOSIS — S92121S Displaced fracture of body of right talus, sequela: Secondary | ICD-10-CM | POA: Insufficient documentation

## 2023-03-18 DIAGNOSIS — G8929 Other chronic pain: Secondary | ICD-10-CM | POA: Insufficient documentation

## 2023-03-18 DIAGNOSIS — M25471 Effusion, right ankle: Secondary | ICD-10-CM | POA: Diagnosis present

## 2023-03-18 DIAGNOSIS — S8401XS Injury of tibial nerve at lower leg level, right leg, sequela: Secondary | ICD-10-CM | POA: Diagnosis present

## 2023-03-18 DIAGNOSIS — G5781 Other specified mononeuropathies of right lower limb: Secondary | ICD-10-CM | POA: Insufficient documentation

## 2023-03-18 NOTE — Progress Notes (Signed)
Safety precautions to be maintained throughout the outpatient stay will include: orient to surroundings, keep bed in low position, maintain call bell within reach at all times, provide assistance with transfer out of bed and ambulation.  

## 2023-06-26 ENCOUNTER — Telehealth: Payer: Self-pay

## 2023-06-26 NOTE — Telephone Encounter (Signed)
 I found a copy of the Form 25M online and placed a copy on your desk so you can see what he is requesting. Are you willing to sign this form? Does he need to have an appointment with you to have it filled out?  [email or web address]

## 2023-06-26 NOTE — Telephone Encounter (Signed)
 Appointment Request (Newest Message First) Rowe Pavy  [facility] Clinical53 minutes ago (11:18 AM)   CB Please see below message.     [place] [facility] (supporting Venetia Night, MD)1 hour ago (11:02 AM)    Appointment Request From: Marylene Buerger   With Provider: Venetia Night [facility] [place]]   Preferred Date Range: 06/29/2023 - 07/03/2023   Preferred Times: Any Time   Reason for visit: Office Visit   Health Maintenance Topic:    Comments: I would like you to sign off on a 67M form for my future treatment regarding my spinal cord stimulator for worker's compensation. This will allow me to have future battery changes and/or any other issues that may come up. Thank you, Caryn Bee.

## 2023-12-07 ENCOUNTER — Encounter: Payer: Self-pay | Admitting: Pain Medicine

## 2023-12-13 NOTE — Progress Notes (Unsigned)
 PROVIDER NOTE: Interpretation of information contained herein should be left to medically-trained personnel. Specific patient instructions are provided elsewhere under Patient Instructions section of medical record. This document was created in part using AI and STT-dictation technology, any transcriptional errors that may result from this process are unintentional.  Patient: Gary Savage  Service: E/M   PCP: Valora Agent, MD  DOB: [birth year]  DOS: 12/14/2023  Provider: Eric DELENA Como, MD  MRN: [id number]  Delivery: Face-to-face  Specialty: Interventional Pain Management  Type: Established Patient  Setting: Ambulatory outpatient facility  Specialty designation: 09  Referring Prov.: Valora Agent, MD  Location: Outpatient office facility       History of present illness (HPI) Mr. QUINN QUAM, a 48 y.o. year old male, is here today because of his No primary diagnosis found.. Mr. Cobos's primary complain today is No chief complaint on file.  Pertinent problems: Mr. 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  Pain Assessment: Severity of   is reported as a  /10. Location:    / . Onset:  . Quality:  . Timing:  . Modifying factor(s):  SABRA Vitals:  vitals were not taken for this visit.  BMI: Estimated body mass index is 25.77 kg/m as calculated from the  following:   Height as of 03/18/23: 6' (1.829 m).   Weight as of 03/18/23: 190 lb (86.2 kg).  Last encounter: 03/18/2023. Last procedure: 03/05/2023.  Reason for encounter:  *** .   Discussed the use of AI scribe software for clinical note transcription with the patient, who gave verbal consent to proceed.  History of Present Illness           Pharmacotherapy Assessment   None MME/day: 0 mg/day   Monitoring: [place] PMP: PDMP reviewed during this encounter.       Pharmacotherapy: No side-effects or adverse reactions reported. Compliance: No problems identified. Effectiveness: Clinically acceptable.  No notes on file  UDS:  Summary  Date Value Ref Range Status  10/02/2021 Note  Final    Comment:    ==================================================================== Compliance Drug Analysis, Ur ==================================================================== Test                             Result       Flag       Units  Drug Present and Declared for Prescription Verification   Gabapentin                     PRESENT      EXPECTED  Drug Present not Declared for Prescription Verification   Ephedrine/Pseudoephedrine      PRESENT      UNEXPECTED   Phenylpropanolamine            PRESENT      UNEXPECTED    Source of ephedrine/pseudoephedrine is most commonly pseudoephedrine    in over-the-counter or prescription cold and allergy medications.  Phenylpropanolamine is an expected metabolite of    ephedrine/pseudoephedrine.  ==================================================================== Test                      Result    Flag   Units      Ref Range   Creatinine              30               mg/dL      >=79 ==================================================================== Declared Medications:  The flagging and interpretation on this report are based on the  following declared medications.  Unexpected results may arise from  inaccuracies in the declared  medications.   **Note: The testing scope of this panel includes these medications:   Gabapentin (Neurontin) ==================================================================== For clinical consultation, please call [phone]. ====================================================================     No results found for: CBDTHCR No results found for: D8THCCBX No results found for: D9THCCBX  ROS  Constitutional: Denies any fever or chills Gastrointestinal: No reported hemesis, hematochezia, vomiting, or acute GI distress Musculoskeletal: Denies any acute onset joint swelling, redness, loss of ROM, or weakness Neurological: No reported episodes of acute onset apraxia, aphasia, dysarthria, agnosia, amnesia, paralysis, loss of coordination, or loss of consciousness  Medication Review  amitriptyline, cyclobenzaprine , and gabapentin  History Review  Allergy: Mr. Harcum is allergic to penicillins and hydrocodone. Drug: Mr. Muzquiz  reports no history of drug use. Alcohol:  reports current alcohol use. Tobacco:  reports that he quit smoking about 7 years ago. His smoking use included cigarettes. He has never used smokeless tobacco. Social: Mr. Rosko  reports that he quit smoking about 7 years ago. His smoking use included cigarettes. He has never used smokeless tobacco. He reports current alcohol use. He reports that he does not use drugs. 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 Surgical: Mr. Lall  has a past surgical history that includes Knee arthroscopy (Left); Ankle fracture surgery (Right, 06/18/2021); and Thoracic laminectomy for spinal cord stimulator (N/A, 10/10/2022). Family: family history includes Healthy in his mother.  Laboratory Chemistry Profile   Renal Lab Results  Component Value  Date   BUN 8 09/30/2022   CREATININE 1.03 09/30/2022   GFRAA >60 10/14/2017   GFRNONAA >60 09/30/2022    Hepatic No results found for: AST, ALT, ALBUMIN, ALKPHOS, HCVAB, AMYLASE, LIPASE, AMMONIA  Electrolytes Lab Results  Component Value Date   NA 136 09/30/2022   K 3.4 (L) 09/30/2022   CL 103 09/30/2022   CALCIUM 9.0 09/30/2022    Bone No results found for: VD25OH, CI874NY7UNU, CI6874NY7, CI7874NY7, 25OHVITD1, 25OHVITD2, 25OHVITD3, TESTOFREE, TESTOSTERONE  Inflammation (CRP: Acute Phase) (ESR: Chronic Phase) No results found for: CRP, ESRSEDRATE, LATICACIDVEN       Note: Above Lab results reviewed.  Recent Imaging Review  DG PAIN CLINIC C-ARM 1-60 MIN NO REPORT Fluoro was used, but no Radiologist interpretation will be provided.  Please refer to NOTES tab for provider progress note. Note: Reviewed        Physical Exam  Vitals: There were no vitals taken for this visit. BMI: Estimated body mass index is 25.77 kg/m as calculated from the following:   Height as of 03/18/23: 6' (1.829 m).   Weight as of 03/18/23: 190 lb (86.2 kg). Ideal: Patient weight not recorded General appearance: Well nourished, well developed, and  well hydrated. In no apparent acute distress Mental status: Alert, oriented x 3 (person, place, & time)       Respiratory: No evidence of acute respiratory distress Eyes: PERLA   Assessment   Diagnosis Status  No diagnosis found. Controlled Controlled Controlled   Updated Problems: No problems updated.  Plan of Care  Problem-specific:  Assessment and Plan            Mr. RHET RORKE has a current medication list which includes the following long-term medication(s): amitriptyline and gabapentin.  Pharmacotherapy (Medications Ordered): No orders of the defined types were placed in this encounter.  Orders:  No orders of the defined types were placed in this encounter.    Interventional  Therapies  Risk  Complexity Considerations:   Day of implant vs. Removal day images.       Planned  Pending:   Oral steroid taper (02/18/2023)  Palliative/therapeutic right ankle joint injection #1    Under consideration:      Completed:   Diagnostic/therapeutic right dorsal and medial ankle steroid inj. (posterior tibial & superficial peroneal nerve) #1 (03/05/2023) (100/100/70/70)  Diagnostic right spinal cord stimulator trial (08/26/2022) (100/100/75/75 + 100% relief of spasms)  Diagnostic/therapeutic right sural + plantar NB x2 (03/18/2022) (4-0/10) (100/100/100 x 2 days/<30)  Permanent spinal cord stimulator implant by Dr. Reeves Nine.   Completed by other providers:   [facility]   Therapeutic  Palliative (PRN) options:   None established     No follow-ups on file.    Recent Visits No visits were found meeting these conditions. Showing recent visits within past 90 days and meeting all other requirements Future Appointments Date Type Provider Dept  12/14/23 Appointment Tanya Glisson, MD [facility]  Showing future appointments within next 90 days and meeting all other requirements  I discussed the assessment and treatment plan with the patient. The patient was provided an opportunity to ask questions and all were answered. The patient agreed with the plan and demonstrated an understanding of the instructions.  Patient advised to call back or seek an in-person evaluation if the symptoms or condition worsens.  Duration of encounter: *** minutes.  Total time on encounter, as per AMA guidelines included both the face-to-face and non-face-to-face time personally spent by the physician and/or other qualified health care professional(s) on the day of the encounter (includes time in activities that require the physician or other qualified health care professional and does not include time in activities normally performed by clinical staff). Physician's  time may include the following activities when performed: Preparing to see the patient (e.g., pre-charting review of records, searching for previously ordered imaging, lab work, and nerve conduction tests) Review of prior analgesic pharmacotherapies. Reviewing PMP Interpreting ordered tests (e.g., lab work, imaging, nerve conduction tests) Performing post-procedure evaluations, including interpretation of diagnostic procedures Obtaining and/or reviewing separately obtained history Performing a medically appropriate examination and/or evaluation Counseling and educating the patient/family/caregiver Ordering medications, tests, or procedures Referring and communicating with other health care professionals (when not separately reported) Documenting clinical information in the electronic or other health record Independently interpreting results (not separately reported) and communicating results to the patient/ family/caregiver Care coordination (not separately reported)  Note by: Glisson DELENA Tanya, MD (TTS and AI technology used. I apologize for any typographical errors that were not detected and corrected.) Date: 12/14/2023; Time: 8:24 AM

## 2023-12-14 ENCOUNTER — Encounter: Payer: Self-pay | Admitting: Pain Medicine

## 2023-12-14 ENCOUNTER — Ambulatory Visit: Attending: Pain Medicine | Admitting: Pain Medicine

## 2023-12-14 VITALS — BP 126/99 | HR 99 | Temp 97.8°F | Resp 16 | Ht 72.0 in | Wt 190.0 lb

## 2023-12-14 DIAGNOSIS — G8929 Other chronic pain: Secondary | ICD-10-CM | POA: Insufficient documentation

## 2023-12-14 DIAGNOSIS — S92121S Displaced fracture of body of right talus, sequela: Secondary | ICD-10-CM | POA: Insufficient documentation

## 2023-12-14 DIAGNOSIS — S9401XS Injury of lateral plantar nerve, right leg, sequela: Secondary | ICD-10-CM | POA: Insufficient documentation

## 2023-12-14 DIAGNOSIS — M25571 Pain in right ankle and joints of right foot: Secondary | ICD-10-CM | POA: Diagnosis present

## 2023-12-14 DIAGNOSIS — M25671 Stiffness of right ankle, not elsewhere classified: Secondary | ICD-10-CM | POA: Diagnosis present

## 2023-12-14 DIAGNOSIS — G5781 Other specified mononeuropathies of right lower limb: Secondary | ICD-10-CM | POA: Diagnosis present

## 2023-12-14 DIAGNOSIS — S8401XS Injury of tibial nerve at lower leg level, right leg, sequela: Secondary | ICD-10-CM | POA: Diagnosis present

## 2023-12-14 DIAGNOSIS — Z9682 Presence of neurostimulator: Secondary | ICD-10-CM | POA: Insufficient documentation

## 2023-12-14 DIAGNOSIS — S81801S Unspecified open wound, right lower leg, sequela: Secondary | ICD-10-CM | POA: Insufficient documentation

## 2023-12-14 DIAGNOSIS — M25471 Effusion, right ankle: Secondary | ICD-10-CM | POA: Diagnosis present

## 2023-12-14 NOTE — Progress Notes (Signed)
 Safety precautions to be maintained throughout the outpatient stay will include: orient to surroundings, keep bed in low position, maintain call bell within reach at all times, provide assistance with transfer out of bed and ambulation.

## 2023-12-14 NOTE — Patient Instructions (Signed)

## 2023-12-22 ENCOUNTER — Encounter: Payer: Self-pay | Admitting: Pain Medicine

## 2023-12-22 ENCOUNTER — Ambulatory Visit
Admission: RE | Admit: 2023-12-22 | Discharge: 2023-12-22 | Disposition: A | Discharge status: Home / Self Care | Source: Ambulatory Visit | Attending: Pain Medicine | Admitting: Pain Medicine

## 2023-12-22 ENCOUNTER — Ambulatory Visit: Attending: Pain Medicine | Admitting: Pain Medicine

## 2023-12-22 VITALS — BP 123/102 | HR 81 | Temp 97.3°F | Resp 15 | Ht 72.0 in | Wt 190.0 lb

## 2023-12-22 DIAGNOSIS — S8401XS Injury of tibial nerve at lower leg level, right leg, sequela: Secondary | ICD-10-CM | POA: Diagnosis not present

## 2023-12-22 DIAGNOSIS — S92102S Unspecified fracture of left talus, sequela: Secondary | ICD-10-CM | POA: Insufficient documentation

## 2023-12-22 DIAGNOSIS — G8929 Other chronic pain: Secondary | ICD-10-CM | POA: Diagnosis present

## 2023-12-22 DIAGNOSIS — G5771 Causalgia of right lower limb: Secondary | ICD-10-CM | POA: Insufficient documentation

## 2023-12-22 DIAGNOSIS — M19271 Secondary osteoarthritis, right ankle and foot: Secondary | ICD-10-CM | POA: Insufficient documentation

## 2023-12-22 DIAGNOSIS — M25671 Stiffness of right ankle, not elsewhere classified: Secondary | ICD-10-CM | POA: Diagnosis not present

## 2023-12-22 DIAGNOSIS — G5781 Other specified mononeuropathies of right lower limb: Secondary | ICD-10-CM

## 2023-12-22 DIAGNOSIS — M25571 Pain in right ankle and joints of right foot: Secondary | ICD-10-CM | POA: Insufficient documentation

## 2023-12-22 DIAGNOSIS — S81801S Unspecified open wound, right lower leg, sequela: Secondary | ICD-10-CM

## 2023-12-22 DIAGNOSIS — X58XXXS Exposure to other specified factors, sequela: Secondary | ICD-10-CM | POA: Insufficient documentation

## 2023-12-22 DIAGNOSIS — M25471 Effusion, right ankle: Secondary | ICD-10-CM

## 2023-12-22 DIAGNOSIS — S92121S Displaced fracture of body of right talus, sequela: Secondary | ICD-10-CM

## 2023-12-22 MED ORDER — METHYLPREDNISOLONE ACETATE 80 MG/ML IJ SUSP
80.0000 mg | Freq: Once | INTRAMUSCULAR | Status: AC
  Administered 2023-12-22: 80 mg
  Filled 2023-12-22: qty 1

## 2023-12-22 MED ORDER — ROPIVACAINE HCL 2 MG/ML IJ SOLN
5.0000 mL | Freq: Once | INTRAMUSCULAR | Status: AC
  Administered 2023-12-22: 20 mL
  Filled 2023-12-22: qty 20

## 2023-12-22 MED ORDER — MIDAZOLAM HCL 2 MG/2ML IJ SOLN
0.5000 mg | Freq: Once | INTRAMUSCULAR | Status: AC
  Administered 2023-12-22: 2 mg via INTRAVENOUS
  Filled 2023-12-22: qty 2

## 2023-12-22 MED ORDER — PENTAFLUOROPROP-TETRAFLUOROETH EX AERO
INHALATION_SPRAY | Freq: Once | CUTANEOUS | Status: AC
  Administered 2023-12-22: 30 via TOPICAL

## 2023-12-22 MED ORDER — LIDOCAINE HCL 2 % IJ SOLN
20.0000 mL | Freq: Once | INTRAMUSCULAR | Status: AC
  Administered 2023-12-22: 400 mg
  Filled 2023-12-22: qty 20

## 2023-12-22 NOTE — Patient Instructions (Signed)

## 2023-12-22 NOTE — Progress Notes (Signed)
 Safety precautions to be maintained throughout the outpatient stay will include: orient to surroundings, keep bed in low position, maintain call bell within reach at all times, provide assistance with transfer out of bed and ambulation.

## 2023-12-22 NOTE — Progress Notes (Signed)
 PROVIDER NOTE: Interpretation of information contained herein should be left to medically-trained personnel. Specific patient instructions are provided elsewhere under Patient Instructions section of medical record. This document was created in part using STT-dictation technology, any transcriptional errors that may result from this process are unintentional.  Patient: Gary Savage Type: Established DOB: [birth year] MRN: [id number] PCP: Valora Agent, MD  Service: Procedure DOS: 12/22/2023 Setting: Ambulatory Location: Ambulatory outpatient facility Delivery: Face-to-face Provider: Eric DELENA Como, MD Specialty: Interventional Pain Management Specialty designation: [id number] Location: Outpatient facility Ref. Prov.: Valora Agent, MD       Interventional Therapy   Type:  Diagnostic True Ankle Steroid Injection #2  Laterality: Right (-RT)  Level:  Ankle   DOS: 12/22/2023  Provider: Eric DELENA Como, MD Imaging: Fluoroscopy-guided Non-spinal ([id number]) Type: Local Anesthesia Local Anesthetic: Lidocaine  1-2% Sedation: None  Indication(s):  Analgesia Route: Infiltration ([place]/IM) IV Access: N/A  Medical Necessity Purpose: Diagnostic/Therapeutic Rationale (medical necessity): procedure needed and proper for the diagnosis and/or treatment of Gary Savage's medical symptoms and needs. Indications: Right ankle pain severe enough to impact quality of life and/or function. 1. Chronic ankle pain (1ry area of Pain) (Right)   2. 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 Arthralgia of ankle (Right)   4. Closed fracture of talus, sequela (Right)   5. Complex regional pain syndrome type 2 of right lower extremity   6. Pain and swelling of ankle (Right)   7. Stiffness of ankle joint (Right)   8. Swelling of ankle (Right)   9. Neuropathy of sural nerve (Right)   10. 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 Patient assisted into a comfortable position. Pressure points checked.  Prep solution: ChloraPrep (2% chlorhexidine  gluconate and 70% isopropyl alcohol) The target area was identified and the area prepped in the usual manner.  Prep Area: Right ankle   area  Materials:   Tray: Block Needle(s):  Type: Regular  Gauge: 25-G Length: 1.5-in  Qty: 1  Pre-op H&P  Assessment  Time-out H&P (Pre-op Assessment):  Gary Savage is a 48 y.o. (year old), male patient, seen today for interventional treatment. He  has a past surgical history that includes Knee arthroscopy (Left); Ankle fracture surgery (Right, 06/18/2021); and Thoracic laminectomy for spinal cord stimulator (N/A, 10/10/2022). Gary Savage has a current medication list which includes the following prescription(s): acetaminophen , amitriptyline, gabapentin, and naproxen  sodium. His primarily concern today is the Ankle Pain  Initial Vital Signs:  Pulse/HCG Rate: 81ECG Heart Rate: 98 Temp: (!) 97.3 F (36.3 C) Resp: 20 BP: (!) 129/97 SpO2: 99 %  BMI: Estimated body mass index is 25.77 kg/m as calculated from the following:   Height as of this encounter: 6' (1.829 m).   Weight as of this encounter: 190 lb (86.2 kg).  Risk Assessment: Allergies: Reviewed. He is allergic to penicillins and hydrocodone.  Allergy Precautions: None required Coagulopathies: Reviewed. None identified.  Blood-thinner therapy: None at this time Active Infection(s): Reviewed. None identified. Gary Savage is afebrile  Site Confirmation: Mr.  Savage was asked to confirm the procedure and laterality before marking  the site Procedure checklist: Completed Consent: Before the procedure and under the influence of no sedative(s), amnesic(s), or anxiolytics, the patient was informed of the treatment options, risks and possible complications. To fulfill our ethical and legal obligations, as recommended by the American Medical Association's Code of Ethics, I have informed the patient of my clinical impression; the nature and purpose of the treatment or procedure; the risks, benefits, and possible complications of the intervention; the alternatives, including doing nothing; the risk(s) and benefit(s) of the alternative treatment(s) or procedure(s); and the risk(s) and benefit(s) of doing nothing. The patient was provided information about the general risks and possible complications associated with the procedure. These may include, but are not limited to: failure to achieve desired goals, infection, bleeding, organ or nerve damage, allergic reactions, paralysis, and death. In addition, the patient was informed of those risks and complications associated to the procedure, such as failure to decrease pain; infection; bleeding; organ or nerve damage with subsequent damage to sensory, motor, and/or autonomic systems, resulting in permanent pain, numbness, and/or weakness of one or several areas of the body; allergic reactions; (i.e.: anaphylactic reaction); and/or death. Furthermore, the patient was informed of those risks and complications associated with the medications. These include, but are not limited to: allergic reactions (i.e.: anaphylactic or anaphylactoid reaction(s)); adrenal axis suppression; blood sugar elevation that in diabetics may result in ketoacidosis or comma; water  retention that in patients with history of congestive heart failure may result in shortness of breath, pulmonary edema, and decompensation with resultant heart failure; weight gain; swelling or edema; medication-induced neural toxicity; particulate  matter embolism and blood vessel occlusion with resultant organ, and/or nervous system infarction; and/or aseptic necrosis of one or more joints. Finally, the patient was informed that Medicine is not an exact science; therefore, there is also the possibility of unforeseen or unpredictable risks and/or possible complications that may result in a catastrophic outcome. The patient indicated having understood very clearly. We have given the patient no guarantees and we have made no promises. Enough time was given to the patient to ask questions, all of which were answered to the patient's satisfaction. Gary Savage has indicated that he wanted to continue with the procedure. Attestation: I, the ordering provider, attest that I have discussed with the patient the benefits, risks, side-effects, alternatives, likelihood of achieving goals, and potential problems during recovery for the procedure that I have provided informed consent. Date  Time: 12/22/2023 12:33 PM  Pre-Procedure Preparation:  Monitoring: As per clinic protocol. Respiration, ETCO2, SpO2, BP, heart rate and rhythm monitor placed and checked for adequate function Safety Precautions: Patient was assessed for positional comfort and pressure points before starting the procedure. Time-out: I initiated and conducted the Time-out before starting the procedure, as per protocol. The patient was asked to participate by confirming the accuracy of the Time Out information. Verification of the correct person, site, and procedure were performed and confirmed by me, the nursing staff, and the patient. Time-out conducted as per Joint Commission's Universal Protocol (UP.01.01.01). Time: 1326 Start Time: 1326 hrs.   Narrative                Start Time: 1326 hrs. Standard Safety Precautions: Protocol guidelines were followed. Aspiration was conducted prior to injection. At no point did I inject any substances, as a needle was being advanced. No attempts  were made at seeking a paresthesia. Safe injection practices and needle disposal  techniques used. Medications properly checked for expiration dates. SDV (single dose vial) medications used.  Local Anesthesia: Skin & deeper tissues infiltrated with local anesthetic. Appropriate amount of time allowed for local anesthetics to take effect.   Technical description: The procedure needles were then advanced to the target area. Proper needle placement secured. Negative aspiration confirmed. Solution injected in intermittent fashion, asking for systemic symptoms every 0.5cc of injectate. The needles were then removed and the area cleansed, making sure to leave some of the prepping solution back to take advantage of its long term bactericidal properties. Approach: Anteromedial approach. Area Prepped: Entire ankle Region ChloraPrep (2% chlorhexidine  gluconate and 70% isopropyl alcohol) Safety Precautions: Aspiration looking for blood return was conducted prior to all injections. At no point did we inject any substances, as a needle was being advanced. No attempts were made at seeking any paresthesias. Safe injection practices and needle disposal techniques used. Medications properly checked for expiration dates. SDV (single dose vial) medications used. Description of the Procedure: Protocol guidelines were followed. The patient was placed in position. The target area was identified and the area prepped in the usual manner. Skin & deeper tissues infiltrated with local anesthetic. Appropriate amount of time allowed to pass for local anesthetics to take effect. The procedure needles were then advanced to the target area. Proper needle placement secured. Negative aspiration confirmed. Solution injected in intermittent fashion, asking for systemic symptoms every 0.5cc of injectate. The needles were then removed and the area cleansed, making sure to leave some of the prepping solution back to take advantage of its long  term bactericidal properties.                                       Vitals:   12/22/23 1324 12/22/23 1330 12/22/23 1331 12/22/23 1335  BP: (!) 124/101 130/87 120/81 (!) 123/102  Pulse:      Resp: 15 15 15    Temp:      TempSrc:      SpO2: 98% 98% 98% 98%  Weight:      Height:         End Time: 1331 hrs.  Imaging Guidance (Non-Spinal):          Type of Imaging Technique: Fluoroscopy Guidance (Non-Spinal) Indication(s): Fluoroscopy guidance for needle placement to enhance accuracy in procedures requiring precise needle localization for targeted delivery of medication in or near specific anatomical locations not easily accessible without such real-time imaging assistance. Exposure Time: Please see nurses notes. Contrast: None used. Fluoroscopic Guidance: I was personally present during the use of fluoroscopy. Tunnel Vision Technique used to obtain the best possible view of the target area. Parallax error corrected before commencing the procedure. Direction-depth-direction technique used to introduce the needle under continuous pulsed fluoroscopy. Once target was reached, antero-posterior, oblique, and lateral fluoroscopic projection used confirm needle placement in all planes. Images permanently stored in EMR. Interpretation: No contrast injected. I personally interpreted the imaging intraoperatively. Adequate needle placement confirmed in multiple planes. Permanent images saved into the patient's record.   Post-operative Assessment:  Post-procedure Vital Signs:  Pulse/HCG Rate: 8189 Temp: (!) 97.3 F (36.3 C) Resp: 15 BP: (!) 123/102 SpO2: 98 %  EBL: None  Complications: No immediate post-treatment complications observed by team, or reported by patient.  Note: The patient tolerated the entire procedure well. A repeat set of vitals were taken after the procedure and the patient was  kept under observation following institutional policy, for this type of procedure.  Post-procedural neurological assessment was performed, showing return to baseline, prior to discharge. The patient was provided with post-procedure discharge instructions, including a section on how to identify potential problems. Should any problems arise concerning this procedure, the patient was given instructions to immediately contact us , at any time, without hesitation. In any case, we plan to contact the patient by telephone for a follow-up status report regarding this interventional procedure.  Comments:  No additional relevant information.  Plan of Care (POC)  Orders:  Orders Placed This Encounter  Procedures   Medium Joint Injection/Arthrocentesis    Level(s): Ankle block Laterality: Right Sedation: Patient's choice Purpose: Palliative Indication(s): Chronic pain    Standing Status:   Future    Expiration Date:   03/22/2024    Scheduling Instructions:     Requested Scheduling Timeframe: Today   DG [facility] C-ARM 1-60 MIN NO REPORT    Intraoperative interpretation by procedural physician at [facility].    Standing Status:   Standing    Number of Occurrences:   1    Reason for exam::   Assistance in needle guidance and placement for procedures requiring needle placement in or near specific anatomical locations not easily accessible without such assistance.   Informed Consent Details: Physician/Practitioner Attestation; Transcribe to consent form and obtain patient signature    Nursing Order: Transcribe to consent form and obtain patient signature. Note: Always confirm laterality of pain with Gary Savage, before procedure.    Physician/Practitioner attestation of informed consent for procedure/surgical case:   I, the physician/practitioner, attest that I have discussed with the patient the benefits, risks, side effects, alternatives, likelihood of achieving goals and potential problems during recovery for the procedure that I have provided informed consent.     Procedure:   Ankle block    Physician/Practitioner performing the procedure:   Saidah Kempton A. Alexzandria Massman, MD    Indication/Reason:   Chronic ankle pain   Provide equipment / supplies at bedside    Procedure tray: Block Tray (Disposable  single use) Skin infiltration needle: Regular 1.5-in, 25-G, (x1) Block Needle type: Regular Amount/quantity: 1 Size: Short(1.5-inch) Gauge: (25G x1) + (22G x1)    Standing Status:   Standing    Number of Occurrences:   1    Specify:   Block Tray   Saline lock IV    Have LR [id number] mL available and administer at 125 mL/hr if patient becomes hypotensive.    Standing Status:   Standing    Number of Occurrences:   1     Opioid Analgesic: No chronic opioid analgesics therapy prescribed by our practice. None MME/day: 0 mg/day    Medications ordered for procedure: Meds ordered this encounter  Medications   lidocaine  (XYLOCAINE ) 2 % (with pres) injection 400 mg   pentafluoroprop-tetrafluoroeth (GEBAUERS) aerosol   midazolam  (VERSED ) injection 0.5-2 mg    Make sure Flumazenil  is available in the pyxis when using this medication. If oversedation occurs, administer 0.2 mg IV over 15 sec. If after 45 sec no response, administer 0.2 mg again over 1 min; may repeat at 1 min intervals; not to exceed 4 doses (1 mg)   ropivacaine  (PF) 2 mg/mL (0.2%) (NAROPIN ) injection 5 mL   methylPREDNISolone  acetate (DEPO-MEDROL ) injection 80 mg   Medications administered: We administered lidocaine , pentafluoroprop-tetrafluoroeth, midazolam , ropivacaine  (PF) 2 mg/mL (0.2%), and methylPREDNISolone  acetate.  See the medical record for exact dosing, route, and time of administration.  Interventional Therapies  Risk  Complexity Considerations:   Day of implant vs. Removal day images.       Planned  Pending:      Under consideration:      Completed:   Diagnostic/therapeutic right dorsal and medial ankle steroid inj. (posterior tibial & superficial peroneal nerve) x2  (12/22/2023)  Diagnostic right spinal cord stimulator trial (08/26/2022) (100/100/75/75 + 100% relief of spasms)  Diagnostic/therapeutic right sural + plantar NB x2 (03/18/2022) (4-0/10) (100/100/100 x 2 days/<30)  Permanent spinal cord stimulator implant by Dr. Reeves Nine. Oral steroid taper (02/18/2023)    Completed by other providers:   [facility]   Therapeutic  Palliative (PRN) options:   None established      Follow-up plan:   Return in about 2 weeks (around 01/05/2024) for Eval-day (M,W), (Face2F), (PPE).     Recent Visits Date Type Provider Dept  12/14/23 Office Visit Tanya Glisson, MD [facility]  Showing recent visits within past 90 days and meeting all other requirements Today's Visits Date Type Provider Dept  12/22/23 Procedure visit Tanya Glisson, MD [facility]  Showing today's visits and meeting all other requirements Future Appointments Date Type Provider Dept  01/11/24 Appointment Tanya Glisson, MD [facility]  Showing future appointments within next 90 days and meeting all other requirements   Disposition: Discharge home  Discharge (Date  Time): 12/22/2023; 1343 hrs.   Primary Care Physician: Valora Agent, MD Location: [facility] Note by: Glisson DELENA Tanya, MD (TTS technology used. I apologize for any typographical errors that were not detected and corrected.) Date: 12/22/2023; Time: 2:06 PM  Disclaimer:  Medicine is not an Visual merchandiser. The only guarantee in medicine is that nothing is guaranteed. It is important to note that the decision to proceed with this intervention was based on the information collected from the patient. The Data and conclusions were drawn from the patient's questionnaire, the interview, and the physical examination. Because the information was provided in large part by the patient, it cannot be guaranteed that it has not been purposely or unconsciously  manipulated. Every effort has been made to obtain as much relevant data as possible for this evaluation. It is important to note that the conclusions that lead to this procedure are derived in large part from the available data. Always take into account that the treatment will also be dependent on availability of resources and existing treatment guidelines, considered by other Pain Management Practitioners as being common knowledge and practice, at the time of the intervention. For Medico-Legal purposes, it is also important to point out that variation in procedural techniques and pharmacological choices are the acceptable norm. The indications, contraindications, technique, and results of the above procedure should only be interpreted and judged by a Board-Certified Interventional Pain Specialist with extensive familiarity and expertise in the same exact procedure and technique.

## 2023-12-23 ENCOUNTER — Telehealth: Payer: Self-pay | Admitting: *Deleted

## 2023-12-23 NOTE — Telephone Encounter (Signed)
 Post procedure call; voicemail left

## 2024-01-10 NOTE — Progress Notes (Unsigned)
 PROVIDER NOTE: Interpretation of information contained herein should be left to medically-trained personnel. Specific patient instructions are provided elsewhere under Patient Instructions section of medical record. This document was created in part using AI and STT-dictation technology, any transcriptional errors that may result from this process are unintentional.  Patient: Gary Savage  Service: E/M   PCP: Valora Agent, MD  DOB: [birth year]  DOS: 01/11/2024  Provider: Eric DELENA Como, MD  MRN: [id number]  Delivery: Face-to-face  Specialty: Interventional Pain Management  Type: Established Patient  Setting: Ambulatory outpatient facility  Specialty designation: 09  Referring Prov.: Valora Agent, MD  Location: Outpatient office facility       History of present illness (HPI) [name], a 48 y.o. year old male, is here today because of his Chronic pain of right ankle [M25.571, G89.29]. [name] primary complain today is No chief complaint on file.  Pertinent problems: 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  Pain Assessment: Severity of   is reported as a  /10. Location:    / . Onset:  . Quality:  . Timing:  . Modifying factor(s):  SABRA Vitals:  vitals were not taken for this visit.  BMI: Estimated body  mass index is 25.77 kg/m as calculated from the following:   Height as of 12/22/23: 6' (1.829 m).   Weight as of 12/22/23: 190 lb (86.2 kg).  Last encounter: 12/14/2023. Last procedure: 12/22/2023.  Reason for encounter: post-procedure evaluation and assessment.   Discussed the use of AI scribe software for clinical note transcription with the patient, who gave verbal consent to proceed.  History of Present Illness          Post-Procedure Evaluation   Type:  Diagnostic True Ankle Steroid Injection #2  Laterality: Right (-RT)  Level:  Ankle   DOS: 12/22/2023  Provider: Eric DELENA Como, MD Imaging: Fluoroscopy-guided Non-spinal ([id number]) Type: Local Anesthesia Local Anesthetic: Lidocaine  1-2% Sedation: None  Indication(s):  Analgesia Route: Infiltration (Browning/IM) IV Access: N/A  Medical Necessity Purpose: Diagnostic/Therapeutic Rationale (medical necessity): procedure needed and proper for the diagnosis and/or treatment of Gary Savage's medical symptoms and needs. Indications: Right ankle pain severe enough to impact quality of life and/or function. 1. Chronic ankle pain (1ry area of Pain) (Right)   2. 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 Arthralgia of ankle (Right)   4. Closed fracture of talus, sequela (Right)   5. Complex regional pain syndrome type 2 of right lower extremity   6. Pain and swelling of ankle (Right)   7. Stiffness of ankle joint (Right)   8. Swelling of ankle (Right)   9. Neuropathy of sural nerve (Right)   10. 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 Subsequent 4-6  hours post-procedure:   ***. Analgesia past initial 6 hours:   ***. Ongoing improvement:  Analgesic:  *** Function:    ***    ROM:    ***     Pharmacotherapy Assessment   Opioid Analgesic: No chronic opioid  analgesics therapy prescribed by our practice. None MME/day: 0 mg/day   Monitoring:  PMP: PDMP reviewed during this encounter.       Pharmacotherapy: No side-effects or adverse reactions reported. Compliance: No problems identified. Effectiveness: Clinically acceptable.  No notes on file  UDS:  Summary  Date Value Ref Range Status  10/02/2021 Note  Final    Comment:    ==================================================================== Compliance Drug Analysis, Ur ==================================================================== Test                             Result       Flag       Units  Drug Present and Declared for Prescription Verification   Gabapentin                     PRESENT      EXPECTED  Drug Present not Declared for Prescription Verification   Ephedrine/Pseudoephedrine      PRESENT      UNEXPECTED   Phenylpropanolamine            PRESENT      UNEXPECTED    Source of ephedrine/pseudoephedrine is most commonly pseudoephedrine    in over-the-counter or prescription cold and allergy medications.    Phenylpropanolamine is an expected metabolite of    ephedrine/pseudoephedrine.  ==================================================================== Test                      Result    Flag   Units      Ref Range   Creatinine              30               mg/dL      >=79 ==================================================================== Declared Medications:  The flagging and interpretation on this report are based on the  following declared medications.  Unexpected results may arise from  inaccuracies in the declared medications.   **Note: The testing scope of this panel includes these medications:   Gabapentin (Neurontin) ==================================================================== For clinical consultation, please call [phone]. ====================================================================     No results found for: CBDTHCR No  results found for: D8THCCBX No results found for: D9THCCBX  ROS  Constitutional: Denies any fever or chills Gastrointestinal: No reported hemesis, hematochezia, vomiting, or acute GI distress Musculoskeletal: Denies any acute onset joint swelling, redness, loss of ROM, or weakness Neurological: No reported episodes of acute onset apraxia, aphasia, dysarthria, agnosia, amnesia, paralysis, loss of coordination, or loss of consciousness  Medication Review  Naproxen  Sodium, acetaminophen , amitriptyline, and gabapentin  History Review  Allergy: [name] is allergic to penicillins and hydrocodone. Drug: [name]  reports no history of drug use. Alcohol:  reports current alcohol use. Tobacco:  reports that he quit smoking about 7 years ago. His smoking use included cigarettes. He has never used smokeless tobacco. Social: Gary Savage  reports that he quit smoking about 7 years ago. His smoking use included cigarettes. He has never used smokeless tobacco. He reports current alcohol use. He reports that he does not use drugs. Medical:  has a past medical history of Anxiety, Arthralgia of right ankle, Closed fracture of talus of  right ankle (10/2020), History of kidney stones, and 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, right, sequela. Surgical: [name]  has a past surgical history that includes Knee arthroscopy (Left); Ankle fracture surgery (Right, 06/18/2021); and Thoracic laminectomy for spinal cord stimulator (N/A, 10/10/2022). Family: family history includes Healthy in his mother.  Laboratory Chemistry Profile   Renal Lab Results  Component Value Date   BUN 8 09/30/2022   CREATININE 1.03 09/30/2022   GFRAA >60 10/14/2017   GFRNONAA >60 09/30/2022    Hepatic No results found for: AST, ALT, ALBUMIN, ALKPHOS, HCVAB, AMYLASE, LIPASE, AMMONIA  Electrolytes Lab Results  Component Value Date   NA 136 09/30/2022   K 3.4 (L) 09/30/2022   CL 103 09/30/2022    CALCIUM 9.0 09/30/2022    Bone No results found for: VD25OH, CI874NY7UNU, CI6874NY7, CI7874NY7, 25OHVITD1, 25OHVITD2, 25OHVITD3, TESTOFREE, TESTOSTERONE  Inflammation (CRP: Acute Phase) (ESR: Chronic Phase) No results found for: CRP, ESRSEDRATE, LATICACIDVEN       Note: Above Lab results reviewed.  Recent Imaging Review  DG PAIN CLINIC C-ARM 1-60 MIN NO REPORT Fluoro was used, but no Radiologist interpretation will be provided.  Please refer to NOTES tab for provider progress note. Note: Reviewed        Physical Exam  Vitals: There were no vitals taken for this visit. BMI: Estimated body mass index is 25.77 kg/m as calculated from the following:   Height as of 12/22/23: 6' (1.829 m).   Weight as of 12/22/23: 190 lb (86.2 kg). Ideal: Patient weight not recorded General appearance: Well nourished, well developed, and well hydrated. In no apparent acute distress Mental status: Alert, oriented x 3 (person, place, & time)       Respiratory: No evidence of acute respiratory distress Eyes: PERLA   Assessment   Diagnosis Status  1. Chronic ankle pain (1ry area of Pain) (Right)   2. Arthralgia of ankle (Right)   3. Closed fracture of talus, sequela (Right)   4. Postop check    Controlled Controlled Controlled   Updated Problems: No problems updated.  Plan of Care  Problem-specific:  Assessment and Plan            Gary Savage has a current medication list which includes the following long-term medication(s): amitriptyline and gabapentin.  Pharmacotherapy (Medications Ordered): No orders of the defined types were placed in this encounter.  Orders:  No orders of the defined types were placed in this encounter.    Interventional Therapies  Risk  Complexity Considerations:   Day of implant vs. Removal day images.       Planned  Pending:      Under consideration:      Completed:   Diagnostic/therapeutic right dorsal and medial  ankle steroid inj. (posterior tibial & superficial peroneal nerve) x2 (12/22/2023)  Diagnostic right spinal cord stimulator trial (08/26/2022) (100/100/75/75 + 100% relief of spasms)  Diagnostic/therapeutic right sural + plantar NB x2 (03/18/2022) (4-0/10) (100/100/100 x 2 days/<30)  Permanent spinal cord stimulator implant by Dr. Reeves Nine. Oral steroid taper (02/18/2023)    Completed by other providers:   [facility]   Therapeutic  Palliative (PRN) options:   None established     No follow-ups on file.    Recent Visits Date Type Provider Dept  12/22/23 Procedure visit Tanya Glisson, MD [facility]  12/14/23 Office Visit Tanya Glisson, MD [facility]  Showing recent visits within past 90 days and meeting all other requirements Future Appointments Date Type Provider  Dept  01/11/24 Appointment Tanya Glisson, MD [facility]  Showing future appointments within next 90 days and meeting all other requirements  I discussed the assessment and treatment plan with the patient. The patient was provided an opportunity to ask questions and all were answered. The patient agreed with the plan and demonstrated an understanding of the instructions.  Patient advised to call back or seek an in-person evaluation if the symptoms or condition worsens.  Duration of encounter: *** minutes.  Total time on encounter, as per AMA guidelines included both the face-to-face and non-face-to-face time personally spent by the physician and/or other qualified health care professional(s) on the day of the encounter (includes time in activities that require the physician or other qualified health care professional and does not include time in activities normally performed by clinical staff). Physician's time may include the following activities when performed: Preparing to see the patient (e.g., pre-charting review of records, searching for previously ordered imaging,  lab work, and nerve conduction tests) Review of prior analgesic pharmacotherapies. Reviewing PMP Interpreting ordered tests (e.g., lab work, imaging, nerve conduction tests) Performing post-procedure evaluations, including interpretation of diagnostic procedures Obtaining and/or reviewing separately obtained history Performing a medically appropriate examination and/or evaluation Counseling and educating the patient/family/caregiver Ordering medications, tests, or procedures Referring and communicating with other health care professionals (when not separately reported) Documenting clinical information in the electronic or other health record Independently interpreting results (not separately reported) and communicating results to the patient/ family/caregiver Care coordination (not separately reported)  Note by: Glisson DELENA Tanya, MD (TTS and AI technology used. I apologize for any typographical errors that were not detected and corrected.) Date: 01/11/2024; Time: 4:13 PM

## 2024-01-11 ENCOUNTER — Ambulatory Visit: Attending: Pain Medicine | Admitting: Pain Medicine

## 2024-01-11 ENCOUNTER — Encounter: Payer: Self-pay | Admitting: Pain Medicine

## 2024-01-11 VITALS — BP 128/93 | HR 92 | Temp 97.4°F | Ht 72.0 in | Wt 190.0 lb

## 2024-01-11 DIAGNOSIS — Z09 Encounter for follow-up examination after completed treatment for conditions other than malignant neoplasm: Secondary | ICD-10-CM | POA: Insufficient documentation

## 2024-01-11 DIAGNOSIS — G8929 Other chronic pain: Secondary | ICD-10-CM | POA: Insufficient documentation

## 2024-01-11 DIAGNOSIS — S92121S Displaced fracture of body of right talus, sequela: Secondary | ICD-10-CM | POA: Insufficient documentation

## 2024-01-11 DIAGNOSIS — M25571 Pain in right ankle and joints of right foot: Secondary | ICD-10-CM | POA: Diagnosis present

## 2024-01-11 DIAGNOSIS — Z9682 Presence of neurostimulator: Secondary | ICD-10-CM | POA: Insufficient documentation

## 2024-01-11 NOTE — Progress Notes (Signed)
 Safety precautions to be maintained throughout the outpatient stay will include: orient to surroundings, keep bed in low position, maintain call bell within reach at all times, provide assistance with transfer out of bed and ambulation.

## 2024-02-22 ENCOUNTER — Encounter: Payer: Self-pay | Admitting: Pain Medicine

## 2024-02-24 ENCOUNTER — Ambulatory Visit (INDEPENDENT_AMBULATORY_CARE_PROVIDER_SITE_OTHER): Admitting: Neurosurgery

## 2024-02-24 ENCOUNTER — Ambulatory Visit (INDEPENDENT_AMBULATORY_CARE_PROVIDER_SITE_OTHER)

## 2024-02-24 ENCOUNTER — Encounter: Payer: Self-pay | Admitting: Neurosurgery

## 2024-02-24 VITALS — BP 140/88 | Ht 72.0 in | Wt 197.0 lb

## 2024-02-24 DIAGNOSIS — M546 Pain in thoracic spine: Secondary | ICD-10-CM

## 2024-02-24 MED ORDER — TIZANIDINE HCL 4 MG PO TABS: 4.0000 mg | ORAL_TABLET | Freq: Three times a day (TID) | ORAL | 0 refills | Status: DC | PRN

## 2024-02-24 MED ORDER — LIDOCAINE 5 % EX PTCH: 1.0000 | MEDICATED_PATCH | CUTANEOUS | 0 refills | Status: AC

## 2024-02-24 NOTE — Progress Notes (Signed)
   REFERRING PHYSICIAN:  Valora Lynwood FALCON, Md [address] [place],  [place] [address]  DOS: 10/10/22  successful placement of a [employer] spinal cord stimulator   HISTORY OF PRESENT ILLNESS:  02/24/24 Gary Savage presents today for follow up regarding thoracic back pain that started a week ago without an particular inciting even. He localizes it the around the area of paddle incision. He describes the pain as a burning sensation primarily just to the left of his incision but does state it radiates into both sides. It seems to be worse with laying down and moving and improves with sitting. He has been taking Tylenol  and Aleve  which have not been helping. He also took 1 dose of Zanaflex that he had left over from previous but states he didn't really notice the difference.  He denies any recurrence of his preoperative symptoms or pain that radiates around the chest wall.  He denies any new weakness.  11/18/22 Gary Savage is status post successful placement of a [employer] spinal cord stimulator.   His foot is feeling much better.  He is having some pain in the left side of his thoracic paraspinous muscles.    PHYSICAL EXAMINATION:  General: Patient is well developed, well nourished, calm, collected, and in no apparent distress.   NEUROLOGICAL:  General: In no acute distress.   Awake, alert, oriented to person, place, and time.  Pupils equal round and reactive to light.  Facial tone is symmetric.     Strength:            Side Iliopsoas Quads Hamstring PF DF EHL  R 5 5 5 5  4- 4-  L 5 5 5 5 5 5    Incisions are well healed. MSK: TTP over thoracic paraspinal muscles L>R   ROS (Neurologic):  Negative except as noted above  IMAGING: Nothing new to review.   ASSESSMENT/PLAN:  Gary Savage is doing well s/p above surgery with worsening thoracic back pain over the last week.  His symptoms sound muscular in nature however history I recommended a thoracic  x-ray to evaluate paddle placement or any underlying fracture. I will send him a message with the results of the xray. In the meantime I recommended that he continue to Tylenol  and Aleve  and have provided him with a refill of Zanaflex as well as a prescription for Lidoderm  patches to use as needed. I also encouraged him to reach out the to [employer] rep. He was encouraged to call the office should he have any questions or concerns or worsening symptoms.   Edsel Goods PA-C Department of neurosurgery

## 2024-03-01 ENCOUNTER — Ambulatory Visit: Payer: Self-pay | Admitting: Neurosurgery

## 2024-03-17 ENCOUNTER — Other Ambulatory Visit: Payer: Self-pay | Admitting: Neurosurgery
